# Patient Record
Sex: Female | Born: 1990 | Hispanic: Yes | Marital: Single | Smoking: Never smoker
Health system: Southern US, Community
[De-identification: ages and names within clinical notes are randomized; demographics above are authoritative.]

## PROBLEM LIST (undated history)

## (undated) DIAGNOSIS — Z789 Other specified health status: Secondary | ICD-10-CM

## (undated) HISTORY — PX: NO PAST SURGERIES: SHX2092

---
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
## 2011-02-15 LAB — GC/CHLAMYDIA PROBE AMP, GENITAL: Chlamydia: NEGATIVE

## 2011-03-11 ENCOUNTER — Inpatient Hospital Stay (HOSPITAL_COMMUNITY)
Admission: AD | Admit: 2011-03-11 | Discharge: 2011-03-13 | DRG: 775 | Disposition: A | Discharge status: Home / Self Care | Payer: Medicaid Other | Source: Ambulatory Visit | Attending: Obstetrics | Admitting: Obstetrics

## 2011-03-11 ENCOUNTER — Encounter (HOSPITAL_COMMUNITY): Payer: Self-pay | Admitting: *Deleted

## 2011-03-11 DIAGNOSIS — O99892 Other specified diseases and conditions complicating childbirth: Principal | ICD-10-CM | POA: Diagnosis present

## 2011-03-11 DIAGNOSIS — D649 Anemia, unspecified: Secondary | ICD-10-CM | POA: Diagnosis not present

## 2011-03-11 DIAGNOSIS — O9903 Anemia complicating the puerperium: Secondary | ICD-10-CM | POA: Diagnosis not present

## 2011-03-11 DIAGNOSIS — Z22330 Carrier of Group B streptococcus: Secondary | ICD-10-CM

## 2011-03-11 DIAGNOSIS — IMO0002 Reserved for concepts with insufficient information to code with codable children: Secondary | ICD-10-CM | POA: Clinically undetermined

## 2011-03-11 LAB — CBC
MCH: 25.2 pg — ABNORMAL LOW (ref 26.0–34.0)
MCHC: 32.1 g/dL (ref 30.0–36.0)
MCV: 78.6 fL (ref 78.0–100.0)
Platelets: 203 10*3/uL (ref 150–400)
RDW: 15.6 % — ABNORMAL HIGH (ref 11.5–15.5)
WBC: 12.9 10*3/uL — ABNORMAL HIGH (ref 4.0–10.5)

## 2011-03-11 MED ORDER — DIBUCAINE 1 % RE OINT: 1.0000 "application " | TOPICAL_OINTMENT | RECTAL | Status: DC | PRN

## 2011-03-11 MED ORDER — LIDOCAINE HCL (PF) 1 % IJ SOLN
30.0000 mL | INTRAMUSCULAR | Status: DC | PRN
  Administered 2011-03-11: 30 mL via SUBCUTANEOUS
  Filled 2011-03-11: qty 30

## 2011-03-11 MED ORDER — ONDANSETRON HCL 4 MG PO TABS: 4.0000 mg | ORAL_TABLET | ORAL | Status: DC | PRN

## 2011-03-11 MED ORDER — ONDANSETRON HCL 4 MG/2ML IJ SOLN: 4.0000 mg | Freq: Four times a day (QID) | INTRAMUSCULAR | Status: DC | PRN

## 2011-03-11 MED ORDER — BENZOCAINE-MENTHOL 20-0.5 % EX AERO
1.0000 "application " | INHALATION_SPRAY | CUTANEOUS | Status: DC | PRN
  Administered 2011-03-11: 1 via TOPICAL

## 2011-03-11 MED ORDER — OXYTOCIN 20 UNITS IN LACTATED RINGERS INFUSION - SIMPLE
125.0000 mL/h | Freq: Once | INTRAVENOUS | Status: DC
Start: 2011-03-11 — End: 2011-03-11

## 2011-03-11 MED ORDER — LACTATED RINGERS IV SOLN: 500.0000 mL | INTRAVENOUS | Status: DC | PRN

## 2011-03-11 MED ORDER — CLINDAMYCIN PHOSPHATE 900 MG/50ML IV SOLN
900.0000 mg | Freq: Three times a day (TID) | INTRAVENOUS | Status: AC
  Administered 2011-03-11 – 2011-03-12 (×3): 900 mg via INTRAVENOUS
  Filled 2011-03-11 (×4): qty 50

## 2011-03-11 MED ORDER — METHYLERGONOVINE MALEATE 0.2 MG/ML IJ SOLN
INTRAMUSCULAR | Status: AC
  Administered 2011-03-11: 0.2 mg via INTRAMUSCULAR
  Filled 2011-03-11: qty 1

## 2011-03-11 MED ORDER — PENICILLIN G POTASSIUM 5000000 UNITS IJ SOLR
5.0000 10*6.[IU] | Freq: Once | INTRAVENOUS | Status: DC
  Administered 2011-03-11: 5 10*6.[IU] via INTRAVENOUS
  Filled 2011-03-11: qty 5

## 2011-03-11 MED ORDER — ZOLPIDEM TARTRATE 5 MG PO TABS: 5.0000 mg | ORAL_TABLET | Freq: Every evening | ORAL | Status: DC | PRN

## 2011-03-11 MED ORDER — SIMETHICONE 80 MG PO CHEW: 80.0000 mg | CHEWABLE_TABLET | ORAL | Status: DC | PRN

## 2011-03-11 MED ORDER — OXYTOCIN 10 UNIT/ML IJ SOLN
INTRAMUSCULAR | Status: AC
  Administered 2011-03-11: 20 [IU]
  Filled 2011-03-11: qty 2

## 2011-03-11 MED ORDER — OXYCODONE-ACETAMINOPHEN 5-325 MG PO TABS: 2.0000 | ORAL_TABLET | ORAL | Status: DC | PRN

## 2011-03-11 MED ORDER — LACTATED RINGERS IV SOLN
INTRAVENOUS | Status: DC
  Administered 2011-03-11: 03:00:00 via INTRAVENOUS

## 2011-03-11 MED ORDER — IBUPROFEN 600 MG PO TABS
600.0000 mg | ORAL_TABLET | Freq: Four times a day (QID) | ORAL | Status: DC | PRN
  Administered 2011-03-11: 600 mg via ORAL
  Filled 2011-03-11: qty 1

## 2011-03-11 MED ORDER — SENNOSIDES-DOCUSATE SODIUM 8.6-50 MG PO TABS
2.0000 | ORAL_TABLET | Freq: Every day | ORAL | Status: DC
  Administered 2011-03-11 – 2011-03-12 (×2): 2 via ORAL

## 2011-03-11 MED ORDER — FERROUS SULFATE 325 (65 FE) MG PO TABS
325.0000 mg | ORAL_TABLET | Freq: Two times a day (BID) | ORAL | Status: DC
  Administered 2011-03-11 – 2011-03-13 (×5): 325 mg via ORAL
  Filled 2011-03-11 (×5): qty 1

## 2011-03-11 MED ORDER — ACETAMINOPHEN 325 MG PO TABS: 650.0000 mg | ORAL_TABLET | ORAL | Status: DC | PRN

## 2011-03-11 MED ORDER — DIPHENHYDRAMINE HCL 25 MG PO CAPS
25.0000 mg | ORAL_CAPSULE | Freq: Four times a day (QID) | ORAL | Status: DC | PRN
Start: 2011-03-11 — End: 2011-03-13

## 2011-03-11 MED ORDER — OXYTOCIN BOLUS FROM INFUSION
500.0000 mL | Freq: Once | INTRAVENOUS | Status: DC
  Administered 2011-03-11: 500 mL via INTRAVENOUS
  Filled 2011-03-11: qty 1000
  Filled 2011-03-11: qty 500

## 2011-03-11 MED ORDER — TETANUS-DIPHTH-ACELL PERTUSSIS 5-2.5-18.5 LF-MCG/0.5 IM SUSP: 0.5000 mL | Freq: Once | INTRAMUSCULAR | Status: DC

## 2011-03-11 MED ORDER — METHYLERGONOVINE MALEATE 0.2 MG/ML IJ SOLN
0.2000 mg | Freq: Once | INTRAMUSCULAR | Status: AC
  Administered 2011-03-11: 0.2 mg via INTRAMUSCULAR

## 2011-03-11 MED ORDER — DIPHENHYDRAMINE HCL 50 MG/ML IJ SOLN
50.0000 mg | Freq: Once | INTRAMUSCULAR | Status: AC
  Administered 2011-03-11: 50 mg via INTRAVENOUS
  Filled 2011-03-11: qty 1

## 2011-03-11 MED ORDER — LANOLIN HYDROUS EX OINT: TOPICAL_OINTMENT | CUTANEOUS | Status: DC | PRN

## 2011-03-11 MED ORDER — FLEET ENEMA 7-19 GM/118ML RE ENEM: 1.0000 | ENEMA | RECTAL | Status: DC | PRN

## 2011-03-11 MED ORDER — PRENATAL PLUS 27-1 MG PO TABS
1.0000 | ORAL_TABLET | Freq: Every day | ORAL | Status: DC
  Administered 2011-03-11 – 2011-03-13 (×3): 1 via ORAL
  Filled 2011-03-11 (×3): qty 1

## 2011-03-11 MED ORDER — BENZOCAINE-MENTHOL 20-0.5 % EX AERO
INHALATION_SPRAY | CUTANEOUS | Status: AC
  Filled 2011-03-11: qty 56

## 2011-03-11 MED ORDER — PENICILLIN G POTASSIUM 5000000 UNITS IJ SOLR
2.5000 10*6.[IU] | INTRAMUSCULAR | Status: DC
  Filled 2011-03-11 (×3): qty 2.5

## 2011-03-11 MED ORDER — PROMETHAZINE HCL 25 MG/ML IJ SOLN
12.5000 mg | Freq: Four times a day (QID) | INTRAMUSCULAR | Status: DC | PRN
  Administered 2011-03-11: 12.5 mg via INTRAVENOUS
  Filled 2011-03-11: qty 1

## 2011-03-11 MED ORDER — IBUPROFEN 600 MG PO TABS
600.0000 mg | ORAL_TABLET | Freq: Four times a day (QID) | ORAL | Status: DC
  Administered 2011-03-11 – 2011-03-13 (×9): 600 mg via ORAL
  Filled 2011-03-11 (×9): qty 1

## 2011-03-11 MED ORDER — ONDANSETRON HCL 4 MG/2ML IJ SOLN: 4.0000 mg | INTRAMUSCULAR | Status: DC | PRN

## 2011-03-11 MED ORDER — OXYTOCIN 10 UNIT/ML IJ SOLN
20.0000 [IU] | Freq: Once | INTRAMUSCULAR | Status: AC
  Administered 2011-03-11: 20 [IU]

## 2011-03-11 MED ORDER — WITCH HAZEL-GLYCERIN EX PADS: 1.0000 "application " | MEDICATED_PAD | CUTANEOUS | Status: DC | PRN

## 2011-03-11 MED ORDER — BUTORPHANOL TARTRATE 2 MG/ML IJ SOLN
1.0000 mg | INTRAMUSCULAR | Status: DC | PRN
  Administered 2011-03-11: 1 mg via INTRAVENOUS
  Filled 2011-03-11: qty 1

## 2011-03-11 MED ORDER — OXYCODONE-ACETAMINOPHEN 5-325 MG PO TABS: 1.0000 | ORAL_TABLET | ORAL | Status: DC | PRN

## 2011-03-11 MED ORDER — CITRIC ACID-SODIUM CITRATE 334-500 MG/5ML PO SOLN: 30.0000 mL | ORAL | Status: DC | PRN

## 2011-03-11 NOTE — Progress Notes (Signed)
UR chart review completed.  

## 2011-03-11 NOTE — Progress Notes (Signed)
MD notified of rash on patients hands, arms, abdomen, and upper thighs. Given an updated on the medications patient received in the past hour. Orders received to discontinue penicillin, change antibiotic to clindamycin, and give 50 mg of benadryl IV. MD also notified of membrane status and FHR.

## 2011-03-11 NOTE — Progress Notes (Signed)
Notified of SVE and FHR. Gaynell Face, MD, said that he is heading to the hospital at this time.

## 2011-03-11 NOTE — H&P (Signed)
This is Dr. dictating the history and physical on  Brenda Cline She's a 20 year old gravida 2 para 0010 at 68 weeks and 2 days she's a 2 03/16/2011 is a positive GBS and after she was washed him with a chlorhexidine she became red the nose there is she also received penicillin at that point she had no itching which was given Benadryl 50 IV and changed to clindamycin 900 every 8 hours On admission her cervix was 4 cm she is no dilated membranes ruptured spontaneously the fluid was clear and she is now on a him a dilated him up with 2 station Past medical history negative Past surgical history negative Family history negative Negative outages is a possibility of penicillin allergy from child this was chart and him Physical exam Well-developed female in no distress HEENT negative Lungs clear Heart the heart regular rhythm no murmurs no gallops breasts negative Abdomen term Pelvic as described above Extremities negative

## 2011-03-11 NOTE — Progress Notes (Addendum)
RN at bedside. While assessing membrane status red rash noted on patients hands, arms, abdomen, and upper thighs. No shortness of breath or itching reported by patient. RN was at bedside speaking with patient at 0340 and rash was not present. RN to notify MD of this finding.

## 2011-03-11 NOTE — Progress Notes (Signed)
Interventions done to rule out allergic reaction: aqua sonic gel was changed to lotion, belts were replaced, EFM/TOCO/BP cuff were sanitized, and patient's abdomen was washed with soap and water.

## 2011-03-11 NOTE — Progress Notes (Signed)
[facility] Department of Clinical Social Work Documentation of Interpretation   I assisted ___Betty RN________________ with interpretation of ____teaching__________________ for this patient.

## 2011-03-11 NOTE — Progress Notes (Signed)
SAYS HURT BAD AT 2300- UNSURE IF SROM- [address]  [address] - CLOSED

## 2011-03-12 LAB — CBC
Hemoglobin: 8.3 g/dL — ABNORMAL LOW (ref 12.0–15.0)
MCH: 25.9 pg — ABNORMAL LOW (ref 26.0–34.0)
MCHC: 32.3 g/dL (ref 30.0–36.0)
RDW: 15.9 % — ABNORMAL HIGH (ref 11.5–15.5)

## 2011-03-12 MED ORDER — CLINDAMYCIN PHOSPHATE 900 MG/50ML IV SOLN
900.0000 mg | Freq: Once | INTRAVENOUS | Status: DC
  Filled 2011-03-12: qty 50

## 2011-03-13 DIAGNOSIS — IMO0002 Reserved for concepts with insufficient information to code with codable children: Secondary | ICD-10-CM | POA: Clinically undetermined

## 2011-03-13 MED ORDER — NORETHINDRONE 0.35 MG PO TABS: 1.0000 | ORAL_TABLET | Freq: Every day | ORAL | Status: DC

## 2011-03-13 MED ORDER — PRENATAL PLUS 27-1 MG PO TABS: 1.0000 | ORAL_TABLET | Freq: Every day | ORAL | Status: DC

## 2011-03-13 MED ORDER — FERROUS SULFATE 325 (65 FE) MG PO TABS: 325.0000 mg | ORAL_TABLET | Freq: Two times a day (BID) | ORAL | Status: DC

## 2011-03-13 MED ORDER — IBUPROFEN 600 MG PO TABS: 600.0000 mg | ORAL_TABLET | Freq: Four times a day (QID) | ORAL | Status: AC

## 2011-03-13 NOTE — Progress Notes (Signed)
PSYCHOSOCIAL ASSESSMENT ~ MATERNAL/CHILD Name: Brenda Cline         Age: 20 years    Referral Date:  03/13/11  Reason/Source:  History of Pregnancy depression/Pediatric Teaching Service I. FAMILY/HOME ENVIRONMENT A. Child's Legal Guardian X Parent(s)  Name:  Brenda Cline DOB:  Jan 14, 2005  Age:  20 years Address:  [address] [place], [place] [address] Name:  Brenda Cline     Address :  same B. Other Household Members/Support Persons   Roommate shares a home with family C.   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   Cultural and Environment Information Cultural Issues Impacting Care:  MOB does not have papers which affects ability to access resources to improved life situation  STRENGTHS X Supportive family/friends   X Adequate Resources  X Home prepared for Child (including basic supplies)                         X Other:  Pediatrician-[facility]  RISK FACTORS AND CURRENT PROBLEMS         X No Problems Noted                       SOCIAL WORK ASSESSMENT LCSW met with MOB and newborn at bedside to assess strengths, needs, resources related to referral for history of depression during pregnancy.  MOB lives with FOB.  They have a roommate who lives in the home and spends time with his girlfriend.  MOB feels safe and supported.  Her pregnancy was marked by emotional stress and uncertainty about providing for baby.  During pregnancy, MOB was asked to leave her  mother's home when the pregnancy was discovered.  MOB moved in with FOB.  The initial move was complicated by a roommate who had feelings for FOB, which FOB did not reciprocate, and roommate did not want MOB in the house.  MOB and FOB found alternative housing and their living situation has improved.  MOB was laid off during the pregnancy as was FOB for a brief time.  FOB is now employed, and MOB reports they are able to meet their basic needs.  MOB had a lot of certainty about her ability to parent, take care of a newborn, and improve her life situation.  I validated and normalized these feelings as very common and universal for many first time mom's.  She has been receiving support from the [facility] home visiting program for first time moms since her 6th month of pregnancy.  She plans to continue the program, and the nurse comes out every two weeks.  Mom also reported that she very much wants to be a CNA.  She loves that line of work, especially providing care to the geriatric population.  Mom was very discouraged  in high school when she completed all her CNA course work, but was denied the right to take the exam due to not having a Tree surgeon number or papers to live and work here.  Mom was still very tearful about this, and she acknowledged she felt her dreams were no longer in reach.  She has since applied 2 months ago for a new program designed to uplift immigrants who came to this country as children in accessing the documents necessary to pursue higher education and reach her goals.  She is excited about this program and very much hopes it will allow her to pursue her goals.  She wants to be a good mom and provide a better future for her child so she can have a better life than what MOB feels she had.  Commended MOB for not giving up and using her problem solving skills to pursue needed supports/programs to improve her life circumstance.  MOB also wants to breastfeed, as she feels it  will afford baby a good start in life.  FOB is also very supportive of breastfeeding and had discussed the benefits to MOB.  MOB is very resilient.  She hopes that over time, maternal grandma will come back into her life in a more supportive role.  MOB has some friends she can count on as well.  FOB does not have family in the area.  She reports FOB as supportive.  MOB reported feeling better after session due to being able to have someone to talk about her feelings.  She reports not having taken medications in the past to help with depression.  She is open to continuing to reach out and talk about her feelings to feel better about herself, her role as a new mother and her future.  Discussed services available at her Pediatrician's office as well as in the community.  MOB will continue to seek support as needed.  MOB is happy for baby's arrival.  She regards her baby warmly, and MOB was loving, attentive, and able to meet baby's needs during session.  MOB exhibited calm, gentle handling and cuddling with baby.  MOB's affect improved by end of session.  She reported improved hopefulness and maternal competence.  She still acknowledges appropriate maternal worry over newborn needs, care, and ability to respond effectively, though she feels the supports available to her will help ease her transition to motherhood to help her be successful.    SOCIAL WORK PLAN X No Further Intervention Required/No Barriers to Discharge  X Patient/Family Education: Newborn care, Maternal role transition, Newborn breastfeeding behavior X Follow up with [facility] staff as needed for additional support X Continue with Nurse [facility] visiting program

## 2011-03-13 NOTE — Progress Notes (Signed)
Post Partum Day #2 S/P:spontaneous vaginal  RH status/Rubella reviewed.  Feeding: breast and bottle Subjective: No HA, SOB, CP, F/C, breast symptoms: no. Normal vaginal bleeding, no clots.     Objective:  Blood pressure 113/75, pulse 86, temperature 98.4 F (36.9 C), temperature source Oral, resp. rate 18, height 4\' 11"  (1.499 m), weight 80.854 kg (178 lb 4 oz), SpO2 95.00%, unknown if currently breastfeeding.   Physical Exam:  General: alert Lochia: appropriate Uterine Fundus: firm DVT Evaluation: No evidence of DVT seen on physical exam. Ext: No c/c/e  Basename 03/12/11 0531 03/11/11 0240  HGB 8.3* 11.2*  HCT 25.7* 34.9*    Assessment/Plan: 20 y.o.  PPD # 2 .  normal postpartum exam patient is a candidate for oral progesterone-only contraceptive for contraception, with no contraindications Anemia stable Continue current postpartum care D/C home   LOS: 2 days   Cline,Brenda Tupou A 03/13/2011, 11:46 AM

## 2011-03-13 NOTE — Discharge Summary (Signed)
  Obstetric Discharge Summary Reason for Admission: onset of labor Prenatal Procedures: none Intrapartum Procedures: spontaneous vaginal delivery Postpartum Procedures: none Complications-Operative and Postpartum: none  Hemoglobin  Date Value Range Status  03/12/2011 8.3* 12.0-15.0 (g/dL) Final     DELTA CHECK NOTED     REPEATED TO VERIFY     HCT  Date Value Range Status  03/12/2011 25.7* 36.0-46.0 (%) Final    Discharge Diagnoses: Term Pregnancy-delivered  Discharge Information: Date: 03/13/2011 Activity: pelvic rest Diet: routine Medications:  Prior to Admission medications   Medication Sig Start Date End Date Taking? Authorizing Provider  ferrous sulfate 325 (65 FE) MG tablet Take 1 tablet (325 mg total) by mouth 2 (two) times daily with a meal. 03/13/11 03/12/12  Roseanna Rainbow, MD  ibuprofen (ADVIL,MOTRIN) 600 MG tablet Take 1 tablet (600 mg total) by mouth every 6 (six) hours. 03/13/11 03/23/11  Roseanna Rainbow, MD  norethindrone (ORTHO MICRONOR) 0.35 MG tablet Take 1 tablet (0.35 mg total) by mouth daily. 2nd Sunday start 03/13/11 03/12/12  Roseanna Rainbow, MD  prenatal vitamin w/FE, FA (PRENATAL 1 + 1) 27-1 MG TABS Take 1 tablet by mouth daily. 03/13/11   Roseanna Rainbow, MD    Condition: stable Instructions: refer to routine discharge instructions Discharge to: home Follow-up Information    Follow up with JACKSON-MOORE,Tremond Shimabukuro A, MD. Call in 6 weeks.   Contact information:   [address] [place] [address] [phone]          Newborn Data: Live born  Information for the patient's newborn:  Clemens Catholic, Girl Tannie [[id number]]  female ; APGAR , ; weight ;  Home with mother.  JACKSON-MOORE,Tyese Finken A 03/13/2011, 12:00 PM

## 2012-05-09 NOTE — L&D Delivery Note (Signed)
Delivery Note At  a viable female was delivered via NSVD (Presentation: LOA).  APGAR and weight: not yet obtained.   Placenta status: intact with 3 vessel cord. No complications with delivery and shoulders. No cord. Rapid delivery.  Bleeding controled with fundal massage and pitocin Anesthesia:  IV fentanyl Episiotomy: none Lacerations: none Suture Repair: NA Est. Blood Loss (mL): 150 cc  Mom to postpartum.  Baby to nursery-stable.  [name] 06/13/2012, 7:04 PM

## 2012-05-09 NOTE — L&D Delivery Note (Signed)
I was present for delivery and agree with note above. MUHAMMAD,Nevae Pinnix  

## 2012-06-11 ENCOUNTER — Encounter (HOSPITAL_COMMUNITY): Payer: Self-pay

## 2012-06-11 ENCOUNTER — Inpatient Hospital Stay (HOSPITAL_COMMUNITY)
Admission: AD | Admit: 2012-06-11 | Discharge: 2012-06-11 | Disposition: A | Discharge status: Home / Self Care | Payer: Self-pay | Source: Ambulatory Visit | Attending: Obstetrics & Gynecology | Admitting: Obstetrics & Gynecology

## 2012-06-11 DIAGNOSIS — O0930 Supervision of pregnancy with insufficient antenatal care, unspecified trimester: Secondary | ICD-10-CM | POA: Insufficient documentation

## 2012-06-11 DIAGNOSIS — O99891 Other specified diseases and conditions complicating pregnancy: Secondary | ICD-10-CM | POA: Insufficient documentation

## 2012-06-11 HISTORY — DX: Other specified health status: Z78.9

## 2012-06-11 NOTE — MAU Note (Signed)
Patient states she has had no prenatal care and just wants to make sure everything if OK. Patient denies contractions, pain, bleeding or leaking and reports good fetal movement.

## 2012-06-11 NOTE — ED Notes (Signed)
Chief Complaint:  No chief complaint on file.   First Provider Initiated Contact with Patient 06/11/12 1424      HPI: Brenda Cline is a 22 y.o. G3P1011 at [redacted]w[redacted]d who presents to maternity admissions for ultrasound. Patient has not received any prenatal care for this pregnancy. She has not applied for medicaid or adopt a mom.  Denies contractions, leakage of fluid or vaginal bleeding. Good fetal movement.   LMP: 1st week of July. Menstrual cycle not regular since last pregnancy: 25-40 days. Not on contraception prior to pregnancy.  No history of STD.   She received prenatal care for her previous pregnancy in 2012 at [place] where she had adopt a mom. Pregnancy was term, NSVD, GBS positive, no complications including GDM or HTN.   Pregnancy Course:  No prenatal care.  Past Medical History: Past Medical History  Diagnosis Date  . No pertinent past medical history     Past obstetric history: OB History    Grav Para Term Preterm Abortions TAB SAB Ect Mult Living   2 1 1       1      # Outc Date GA Lbr Len/2nd Wgt Sex Del Anes PTL Lv   1 TRM 11/12 [redacted]w[redacted]d 06:40 / 00:12 7lb14.5oz(3.586kg) F SVD Local  Yes   2 CUR               Past Surgical History: Past Surgical History  Procedure Date  . No past surgeries     Family History: Family History  Problem Relation Age of Onset  . Other Neg Hx     Social History: History  Substance Use Topics  . Smoking status: Never Smoker   . Smokeless tobacco: Never Used  . Alcohol Use: No    Allergies:  Allergies  Allergen Reactions  . Penicillins Rash    Meds:  Prescriptions prior to admission  Medication Sig Dispense Refill  . Prenatal Vit-Fe Fumarate-FA (PRENATAL MULTIVITAMIN) TABS Take 1 tablet by mouth daily.        ROS: Pertinent findings in history of present illness.  Physical Exam  Blood pressure 115/68, pulse 88, temperature 97.9 F (36.6 C), temperature source Oral, resp. rate 16, height 5' 0.5" (1.537 m),  weight 175 lb 3.2 oz (79.47 kg), last menstrual period 11/07/2011, SpO2 99.00%, unknown if currently breastfeeding. GENERAL: Well-developed, well-nourished female in no acute distress.  HEENT: normocephalic HEART: normal rate RESP: normal effort ABDOMEN: Soft, non-tender, gravid appropriate for gestational age EXTREMITIES: Nontender, no edema NEURO: alert and oriented    FHT:  Baseline 135 , moderate variability, accelerations present 10x10, no decelerations Contractions: irritability, no real contractions   Labs: No results found for this or any previous visit (from the past 24 hour(s)).  Imaging:  No results found. MAU Course:   Assessment: 1. Late prenatal care    22 yo G3P1011 at 31wga by LMP.  Plan:  - Late prenatal care and access to future care: spoke with CSW Tedra who told me that she would be eligible for emergency medicaid which would only cover L&D costs. There is possibility to apply for charity help. Will give patient the name and number of Burman Foster who might be able to help her with this. appointment [facility] made for Thursday February 13th at 9am.   - measuring greater than estimated gestational age: will obtain US for as soon as possible.  - Discharge home  Labor precautions and fetal kick counts Follow-up Information  Follow up with [facility]. On 06/21/2012.   Contact information:   [address] [place] [address]           Medication List     As of 06/11/2012  2:57 PM    TAKE these medications         prenatal multivitamin Tabs   Take 1 tablet by mouth daily.        Lonia Skinner, MD 06/11/2012 2:57 PM  I saw and examined patient and agree with above resident note. Patient to follow up with outpatient ultrasound and appointment at [facility]. I reviewed NST, which was reactive. Napoleon Form, MD

## 2012-06-12 DIAGNOSIS — Z3201 Encounter for pregnancy test, result positive: Secondary | ICD-10-CM

## 2012-06-13 ENCOUNTER — Inpatient Hospital Stay (HOSPITAL_COMMUNITY)
Admission: AD | Admit: 2012-06-13 | Discharge: 2012-06-15 | DRG: 775 | Disposition: A | Discharge status: Home / Self Care | Payer: Medicaid Other | Source: Ambulatory Visit | Attending: Obstetrics and Gynecology | Admitting: Obstetrics and Gynecology

## 2012-06-13 ENCOUNTER — Inpatient Hospital Stay (HOSPITAL_COMMUNITY): Payer: Medicaid Other

## 2012-06-13 ENCOUNTER — Encounter (HOSPITAL_COMMUNITY): Payer: Self-pay | Admitting: *Deleted

## 2012-06-13 DIAGNOSIS — D649 Anemia, unspecified: Secondary | ICD-10-CM | POA: Diagnosis not present

## 2012-06-13 DIAGNOSIS — O9903 Anemia complicating the puerperium: Secondary | ICD-10-CM

## 2012-06-13 DIAGNOSIS — O0930 Supervision of pregnancy with insufficient antenatal care, unspecified trimester: Secondary | ICD-10-CM

## 2012-06-13 DIAGNOSIS — IMO0002 Reserved for concepts with insufficient information to code with codable children: Secondary | ICD-10-CM

## 2012-06-13 LAB — HEPATITIS B SURFACE ANTIGEN: Hepatitis B Surface Ag: NEGATIVE

## 2012-06-13 LAB — RAPID HIV SCREEN (WH-MAU): Rapid HIV Screen: NONREACTIVE

## 2012-06-13 LAB — DIFFERENTIAL
Basophils Absolute: 0 10*3/uL (ref 0.0–0.1)
Basophils Relative: 0 % (ref 0–1)
Eosinophils Absolute: 0 10*3/uL (ref 0.0–0.7)
Eosinophils Relative: 0 % (ref 0–5)
Neutrophils Relative %: 70 % (ref 43–77)

## 2012-06-13 LAB — ABO/RH: ABO/RH(D): O POS

## 2012-06-13 LAB — RAPID URINE DRUG SCREEN, HOSP PERFORMED
Amphetamines: NOT DETECTED
Barbiturates: NOT DETECTED
Benzodiazepines: NOT DETECTED

## 2012-06-13 LAB — TYPE AND SCREEN
ABO/RH(D): O POS
Antibody Screen: NEGATIVE

## 2012-06-13 LAB — CBC
MCH: 21.9 pg — ABNORMAL LOW (ref 26.0–34.0)
MCV: 70.3 fL — ABNORMAL LOW (ref 78.0–100.0)
Platelets: 240 10*3/uL (ref 150–400)
RDW: 16.2 % — ABNORMAL HIGH (ref 11.5–15.5)

## 2012-06-13 LAB — OB RESULTS CONSOLE HIV ANTIBODY (ROUTINE TESTING): HIV: NONREACTIVE

## 2012-06-13 MED ORDER — LACTATED RINGERS IV SOLN: 500.0000 mL | INTRAVENOUS | Status: DC | PRN

## 2012-06-13 MED ORDER — LANOLIN HYDROUS EX OINT: TOPICAL_OINTMENT | CUTANEOUS | Status: DC | PRN

## 2012-06-13 MED ORDER — ONDANSETRON HCL 4 MG/2ML IJ SOLN: 4.0000 mg | Freq: Four times a day (QID) | INTRAMUSCULAR | Status: DC | PRN

## 2012-06-13 MED ORDER — BENZOCAINE-MENTHOL 20-0.5 % EX AERO
1.0000 "application " | INHALATION_SPRAY | CUTANEOUS | Status: DC | PRN
  Administered 2012-06-14: 1 via TOPICAL
  Filled 2012-06-13: qty 56

## 2012-06-13 MED ORDER — CITRIC ACID-SODIUM CITRATE 334-500 MG/5ML PO SOLN: 30.0000 mL | ORAL | Status: DC | PRN

## 2012-06-13 MED ORDER — OXYTOCIN 40 UNITS IN LACTATED RINGERS INFUSION - SIMPLE MED: 62.5000 mL/h | INTRAVENOUS | Status: DC

## 2012-06-13 MED ORDER — ONDANSETRON HCL 4 MG PO TABS: 4.0000 mg | ORAL_TABLET | ORAL | Status: DC | PRN

## 2012-06-13 MED ORDER — PRENATAL MULTIVITAMIN CH
1.0000 | ORAL_TABLET | Freq: Every day | ORAL | Status: DC
  Administered 2012-06-14 – 2012-06-15 (×2): 1 via ORAL
  Filled 2012-06-13 (×2): qty 1

## 2012-06-13 MED ORDER — DIPHENHYDRAMINE HCL 25 MG PO CAPS: 25.0000 mg | ORAL_CAPSULE | Freq: Four times a day (QID) | ORAL | Status: DC | PRN

## 2012-06-13 MED ORDER — ACETAMINOPHEN 325 MG PO TABS: 650.0000 mg | ORAL_TABLET | ORAL | Status: DC | PRN

## 2012-06-13 MED ORDER — ZOLPIDEM TARTRATE 5 MG PO TABS: 5.0000 mg | ORAL_TABLET | Freq: Every evening | ORAL | Status: DC | PRN

## 2012-06-13 MED ORDER — FENTANYL CITRATE 0.05 MG/ML IJ SOLN
INTRAMUSCULAR | Status: AC
  Administered 2012-06-13: 100 ug via INTRAVENOUS
  Filled 2012-06-13: qty 2

## 2012-06-13 MED ORDER — OXYTOCIN 40 UNITS IN LACTATED RINGERS INFUSION - SIMPLE MED
INTRAVENOUS | Status: AC
  Filled 2012-06-13: qty 1000

## 2012-06-13 MED ORDER — IBUPROFEN 600 MG PO TABS
600.0000 mg | ORAL_TABLET | Freq: Four times a day (QID) | ORAL | Status: DC
  Administered 2012-06-14 – 2012-06-15 (×7): 600 mg via ORAL
  Filled 2012-06-13 (×7): qty 1

## 2012-06-13 MED ORDER — LIDOCAINE HCL (PF) 1 % IJ SOLN
30.0000 mL | INTRAMUSCULAR | Status: DC | PRN
  Filled 2012-06-13: qty 30

## 2012-06-13 MED ORDER — TETANUS-DIPHTH-ACELL PERTUSSIS 5-2.5-18.5 LF-MCG/0.5 IM SUSP
0.5000 mL | Freq: Once | INTRAMUSCULAR | Status: AC
  Administered 2012-06-15: 0.5 mL via INTRAMUSCULAR
  Filled 2012-06-13: qty 0.5

## 2012-06-13 MED ORDER — OXYTOCIN BOLUS FROM INFUSION
500.0000 mL | INTRAVENOUS | Status: DC
  Administered 2012-06-13: 500 mL via INTRAVENOUS

## 2012-06-13 MED ORDER — IBUPROFEN 600 MG PO TABS: 600.0000 mg | ORAL_TABLET | Freq: Four times a day (QID) | ORAL | Status: DC | PRN

## 2012-06-13 MED ORDER — WITCH HAZEL-GLYCERIN EX PADS: 1.0000 "application " | MEDICATED_PAD | CUTANEOUS | Status: DC | PRN

## 2012-06-13 MED ORDER — OXYCODONE-ACETAMINOPHEN 5-325 MG PO TABS: 1.0000 | ORAL_TABLET | ORAL | Status: DC | PRN

## 2012-06-13 MED ORDER — CLINDAMYCIN PHOSPHATE 900 MG/50ML IV SOLN
900.0000 mg | Freq: Three times a day (TID) | INTRAVENOUS | Status: DC
  Administered 2012-06-13: 900 mg via INTRAVENOUS
  Filled 2012-06-13 (×2): qty 50

## 2012-06-13 MED ORDER — DIBUCAINE 1 % RE OINT: 1.0000 "application " | TOPICAL_OINTMENT | RECTAL | Status: DC | PRN

## 2012-06-13 MED ORDER — ONDANSETRON HCL 4 MG/2ML IJ SOLN: 4.0000 mg | INTRAMUSCULAR | Status: DC | PRN

## 2012-06-13 MED ORDER — FENTANYL CITRATE 0.05 MG/ML IJ SOLN
100.0000 ug | INTRAMUSCULAR | Status: DC | PRN
  Administered 2012-06-13: 100 ug via INTRAVENOUS

## 2012-06-13 MED ORDER — SIMETHICONE 80 MG PO CHEW: 80.0000 mg | CHEWABLE_TABLET | ORAL | Status: DC | PRN

## 2012-06-13 MED ORDER — OXYCODONE-ACETAMINOPHEN 5-325 MG PO TABS
1.0000 | ORAL_TABLET | ORAL | Status: DC | PRN
  Administered 2012-06-14: 1 via ORAL
  Administered 2012-06-14: 2 via ORAL
  Administered 2012-06-14: 1 via ORAL
  Filled 2012-06-13 (×2): qty 2
  Filled 2012-06-13: qty 1

## 2012-06-13 MED ORDER — LACTATED RINGERS IV SOLN
INTRAVENOUS | Status: DC
  Administered 2012-06-13: 15:00:00 via INTRAVENOUS

## 2012-06-13 MED ORDER — SENNOSIDES-DOCUSATE SODIUM 8.6-50 MG PO TABS
2.0000 | ORAL_TABLET | Freq: Every day | ORAL | Status: DC
  Administered 2012-06-13 – 2012-06-14 (×2): 2 via ORAL

## 2012-06-13 NOTE — MAU Note (Signed)
Pt waiting for bed

## 2012-06-13 NOTE — H&P (Signed)
Brenda Cline is a 22 y.o. female presenting for active labor. Patient reports onset of contractions earlier this morning but worsened over the past 2 hours. Patient without any prenatal care during the pregnancy. Patient reports LMP during first week of July but admits to being irregular since her last pregnancy in 2012. Patient was scheduled for dating ultrasound next week. Patient reports an uncomplicated term pregnancy delivered vaginally in 2012 under the care of [facility]. She denies h/o diabetes or HTN.   Maternal Medical History:  Reason for admission: Reason for admission: contractions.  Contractions: Onset was 3-5 hours ago.   Frequency: regular.   Duration is approximately 1 minute.   Perceived severity is moderate.    Fetal activity: Perceived fetal activity is normal.   Last perceived fetal movement was within the past hour.    Prenatal Complications - Diabetes: none.    OB History    Grav Para Term Preterm Abortions TAB SAB Ect Mult Living   2 1 1       1      Past Medical History  Diagnosis Date  . No pertinent past medical history    Past Surgical History  Procedure Date  . No past surgeries    Family History: family history is negative for Other and Hearing loss. Social History:  reports that she has never smoked. She has never used smokeless tobacco. She reports that she does not drink alcohol or use illicit drugs.   Prenatal Transfer Tool  Maternal Diabetes: No Genetic Screening: Declined Maternal Ultrasounds/Referrals: Declined Fetal Ultrasounds or other Referrals:  None Maternal Substance Abuse:  No Significant Maternal Medications:  None Significant Maternal Lab Results:  None Other Comments:  Patient presented in labor on 06/13/2012 without receiving any prenatal care  Review of Systems  All other systems reviewed and are negative.    Dilation: 5.5 Effacement (%): 100 Station: -2 Exam by:: Dr. Jolayne Panther Blood pressure 125/82, pulse 79,  temperature 98.2 F (36.8 C), temperature source Oral, resp. rate 20, last menstrual period 11/07/2011, unknown if currently breastfeeding. Exam Physical Exam  GENERAL: Well-developed, well-nourished female in no acute distress.  HEENT: Normocephalic, atraumatic. Sclerae anicteric.  NECK: Supple. Normal thyroid.  LUNGS: Clear to auscultation bilaterally.  HEART: Regular rate and rhythm. ABDOMEN: Soft, gravid, nontender. Fundal height- 37 cm. PELVIC: Normal external female genitalia. Vagina is pink and rugated.   Cervix: 5-6/100/-2 EXTREMITIES: No cyanosis, clubbing, or edema, 2+ distal pulses.  FHT: baseline 140, mod variability, +accels, no decels Toco: ctx q3-5 minutes  Prenatal labs: ABO, Rh:   Antibody:   Rubella:   RPR:    HBsAg:    HIV:    GBS:     Assessment/Plan: 22 yo G2P1 at 31w2 days by uncertain LMP presenting in active labor -  Admit to L&D - Will obtain dating ultrasound - rapid GBS collected - pain management prn - anticipate NSVD   Daundre Biel 06/13/2012, 2:30 PM

## 2012-06-13 NOTE — MAU Note (Signed)
Dr Constant at bedside. 

## 2012-06-13 NOTE — Progress Notes (Signed)
Merge Obix, previous recordings under Clemens Catholic, Kristle, same MRN

## 2012-06-13 NOTE — MAU Note (Signed)
Bedside us being performed

## 2012-06-13 NOTE — MAU Note (Signed)
No movement this morning.

## 2012-06-13 NOTE — MAU Note (Signed)
Started cramping this morning, now closer and stronger.  Had some bleeding this morning. No water leaking. 2nd preg.  Vag del with first.  No prenatal care with present preg.

## 2012-06-13 NOTE — MAU Note (Signed)
[place] complete, measures 37.4 wks per hc via U/S

## 2012-06-14 LAB — RUBELLA SCREEN: Rubella: 5.01 Index — ABNORMAL HIGH (ref <0.90)

## 2012-06-14 LAB — CBC
HCT: 23.6 % — ABNORMAL LOW (ref 36.0–46.0)
Hemoglobin: 7.5 g/dL — ABNORMAL LOW (ref 12.0–15.0)
RBC: 3.35 MIL/uL — ABNORMAL LOW (ref 3.87–5.11)
RDW: 16.3 % — ABNORMAL HIGH (ref 11.5–15.5)
WBC: 10.8 10*3/uL — ABNORMAL HIGH (ref 4.0–10.5)

## 2012-06-14 MED ORDER — INFLUENZA VIRUS VACC SPLIT PF IM SUSP
0.5000 mL | INTRAMUSCULAR | Status: AC
  Administered 2012-06-15: 0.5 mL via INTRAMUSCULAR
  Filled 2012-06-14: qty 0.5

## 2012-06-14 MED ORDER — FERROUS SULFATE 325 (65 FE) MG PO TABS
325.0000 mg | ORAL_TABLET | Freq: Three times a day (TID) | ORAL | Status: DC
  Administered 2012-06-14 – 2012-06-15 (×3): 325 mg via ORAL
  Filled 2012-06-14 (×3): qty 1

## 2012-06-14 MED ORDER — PNEUMOCOCCAL VAC POLYVALENT 25 MCG/0.5ML IJ INJ
0.5000 mL | INJECTION | Freq: Once | INTRAMUSCULAR | Status: DC
  Filled 2012-06-14: qty 0.5

## 2012-06-14 MED ORDER — INFLUENZA VIRUS VACC SPLIT PF IM SUSP: 0.5000 mL | Freq: Once | INTRAMUSCULAR | Status: DC

## 2012-06-14 NOTE — Clinical Social Work Maternal (Signed)
    Clinical Social Work Department PSYCHOSOCIAL ASSESSMENT - MATERNAL/CHILD 06/14/2012  Patient:  Brenda Cline, Brenda Cline  Account Number:  [id number]  Admit Date:  06/13/2012  Marjo Bicker Name:   [facility]    Clinical Social Worker:  Nobie Putnam, LCSW   Date/Time:  06/14/2012 04:07 PM  Date Referred:  06/14/2012   Referral source  CN     Referred reason  Other - See comment   Other referral source:    I:  FAMILY / HOME ENVIRONMENT Child's legal guardian:  PARENT  Guardian - Name Guardian - Age Guardian - Address  Anastasia Tompson [address]; [place], [place] [address]  Mauricio Sherlon Handing 30 (same as above)   Other household support members/support persons Name Relationship DOB  Roseanne Reno Juerroero-Castillo DAUGHTER 03/11/11   Other support:   Genevieve Norlander, mother    II  PSYCHOSOCIAL DATA Information Source:  Patient Interview  Financial and Community Resources Employment:   Financial resources:  Self Pay If [employer] - County:    School / Grade:   Maternity Care Coordinator / Child Services Coordination / Early Interventions:  Cultural issues impacting care:    III  STRENGTHS Strengths  Adequate Resources  Home prepared for Child (including basic supplies)  Supportive family/friends   Strength comment:    IV  RISK FACTORS AND CURRENT PROBLEMS Current Problem:  YES   Risk Factor & Current Problem Patient Issue Family Issue Risk Factor / Current Problem Comment  Other - See comment Y N NPNC    V  SOCIAL WORK ASSESSMENT CSW referral received to assess pt's reason for [facility].  Pt did not have insurance & did not have the income to pay for visit independently.  CSW explained hospital drug testing policy & she denies any use history.  UDS is negative, meconium results are pending.  Pt identified the FOB and her mother, as her primary support system.  She has supplies for the infant and appears to be bonding well.  CSW will continue to monitor drug  screen results and make a referral if needed.      VI SOCIAL WORK PLAN Social Work Plan  No Further Intervention Required / No Barriers to Discharge   Type of pt/family education:   If child protective services report - county:   If child protective services report - date:   Information/referral to community resources comment:   Other social work plan:

## 2012-06-14 NOTE — Progress Notes (Signed)
UR completed 

## 2012-06-14 NOTE — Progress Notes (Signed)
Post Partum Day 1 Subjective: up ad lib, voiding, tolerating PO, + flatus and crampy uterine pain  Objective: Blood pressure 97/65, pulse 76, temperature 97.5 F (36.4 C), temperature source Oral, resp. rate 18, height 5' 0.51" (1.537 m), weight 79.379 kg (175 lb), last menstrual period 11/07/2011, unknown if currently breastfeeding.  Physical Exam:  General: alert, cooperative and no distress Lochia: appropriate Uterine Fundus: firm Incision: n/a DVT Evaluation: No evidence of DVT seen on physical exam. Negative Homan's sign. No cords or calf tenderness. No significant calf/ankle edema.   Basename 06/14/12 0510 06/13/12 1502  HGB 7.5* 9.6*  HCT 23.6* 30.8*    Assessment/Plan: Plan for discharge tomorrow and Breastfeeding, unsure contraception Anemia:  Will start FeSO4, asymptomatic   LOS: 1 day   Napoleon Form 06/14/2012, 11:12 AM

## 2012-06-15 MED ORDER — SENNOSIDES-DOCUSATE SODIUM 8.6-50 MG PO TABS: 2.0000 | ORAL_TABLET | Freq: Every day | ORAL | Status: DC

## 2012-06-15 MED ORDER — IBUPROFEN 600 MG PO TABS: 600.0000 mg | ORAL_TABLET | Freq: Four times a day (QID) | ORAL | Status: DC

## 2012-06-15 MED ORDER — FERROUS SULFATE 325 (65 FE) MG PO TABS: 325.0000 mg | ORAL_TABLET | Freq: Three times a day (TID) | ORAL | Status: DC

## 2012-06-15 NOTE — Discharge Summary (Signed)
Saw patient and I agree with the resident note  Brenda Cline  06/15/2012

## 2012-06-15 NOTE — Discharge Summary (Signed)
Obstetric Discharge Summary Reason for Admission: onset of labor 22 yo G2P1 at 31w2 days by uncertain LMP and no prenatal care who presented in active labor. Estimated gestational age by [place] was 23 wga. Rapid GBS was negative.  Prenatal Procedures: none Intrapartum Procedures: spontaneous vaginal delivery Postpartum Procedures: patient found to be anemic but not symptomatic. Ferrous sulfate tid started.  Complications-Operative and Postpartum: none Hemoglobin  Date Value Range Status  06/14/2012 7.5* 12.0 - 15.0 g/dL Final     DELTA CHECK NOTED     REPEATED TO VERIFY     HCT  Date Value Range Status  06/14/2012 23.6* 36.0 - 46.0 % Final    Physical Exam:  General: alert, cooperative and no distress Lochia: appropriate Uterine Fundus: firm DVT Evaluation: No evidence of DVT seen on physical exam. Negative Homan's sign.  Discharge Diagnoses: pre-term pregnancy delivered- uncertain dates  Discharge Information: Date: 06/15/2012 Activity: pelvic rest Diet: routine Medications: PNV, Ibuprofen, Colace and Iron Condition: stable Instructions: refer to practice specific booklet Discharge to: home  Social Work was consulted for no prenatal care and patient was found to be safe to be discharged home.  Breastfeeding and formula feeding Plans on depo and IUD afterwards for contraception.   Newborn Data: Live born female  Birth Weight: 8 lb 4 oz (3742 g) APGAR: 9, 9  Home with mother.  Brenda Cline 06/15/2012, 7:59 AM

## 2012-06-20 ENCOUNTER — Ambulatory Visit: Payer: Medicaid Other

## 2012-06-21 ENCOUNTER — Encounter: Payer: Medicaid Other | Admitting: Obstetrics and Gynecology

## 2012-06-22 ENCOUNTER — Ambulatory Visit (HOSPITAL_COMMUNITY): Payer: Self-pay

## 2013-07-30 ENCOUNTER — Emergency Department (HOSPITAL_COMMUNITY)
Admission: EM | Admit: 2013-07-30 | Discharge: 2013-07-30 | Disposition: A | Discharge status: Home / Self Care | Payer: Self-pay | Attending: Emergency Medicine | Admitting: Emergency Medicine

## 2013-07-30 ENCOUNTER — Encounter (HOSPITAL_COMMUNITY): Payer: Self-pay | Admitting: Emergency Medicine

## 2013-07-30 DIAGNOSIS — Z79899 Other long term (current) drug therapy: Secondary | ICD-10-CM | POA: Insufficient documentation

## 2013-07-30 DIAGNOSIS — Z880 Allergy status to penicillin: Secondary | ICD-10-CM | POA: Insufficient documentation

## 2013-07-30 DIAGNOSIS — Y92410 Unspecified street and highway as the place of occurrence of the external cause: Secondary | ICD-10-CM | POA: Insufficient documentation

## 2013-07-30 DIAGNOSIS — S335XXA Sprain of ligaments of lumbar spine, initial encounter: Secondary | ICD-10-CM | POA: Insufficient documentation

## 2013-07-30 DIAGNOSIS — Y9389 Activity, other specified: Secondary | ICD-10-CM | POA: Insufficient documentation

## 2013-07-30 DIAGNOSIS — S39012A Strain of muscle, fascia and tendon of lower back, initial encounter: Secondary | ICD-10-CM
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
## 2014-03-10 ENCOUNTER — Encounter (HOSPITAL_COMMUNITY): Payer: Self-pay | Admitting: Emergency Medicine

## 2014-05-09 NOTE — L&D Delivery Note (Signed)
Patient is 24 y.o. [id number] [redacted]w[redacted]d admitted for SOL, hx of late prenatal care and borderline msAFP.  Delivery Note At 12:24 PM a viable female was delivered via Vaginal, Spontaneous Delivery (Presentation: ; Occiput Anterior).  APGAR: 7, 9; weight pending. Placenta status: Intact, Spontaneous.  Cord: 3 vessels with the following complications: None.   Anesthesia: None  Episiotomy: None Lacerations: None Est. Blood Loss (mL): 100  Upon arrival to room patient was stating she felt like she needed to push and was in discomfort. Patient checked and was noted to be 10cm dilated and +1 station. She had involuntary pushing and delivered a healthy baby boy. Delivery was precipitous. Baby delivered without difficulty, was noted to have good tone and place on maternal abdomen for oral suctioning, drying and stimulation. Delayed cord clamping performed. Placenta delivered intact with 3V cord. Vaginal canal and perineum were inspected and no lacerations; hemostatic. Pitocin was started and uterus massaged until bleeding slowed. Counts of sharps, instruments, and lap pads were all correct.    Mom to postpartum.  Baby to Couplet care / Skin to Skin.   Caryl Ada, DO 10/21/2014, 12:54 PM PGY-1, [facility]

## 2014-07-14 ENCOUNTER — Other Ambulatory Visit (HOSPITAL_COMMUNITY): Payer: Self-pay | Admitting: Urology

## 2014-07-14 DIAGNOSIS — Z3689 Encounter for other specified antenatal screening: Secondary | ICD-10-CM

## 2014-07-22 ENCOUNTER — Ambulatory Visit (HOSPITAL_COMMUNITY): Payer: No Typology Code available for payment source

## 2014-07-25 ENCOUNTER — Ambulatory Visit (HOSPITAL_COMMUNITY)
Admission: RE | Admit: 2014-07-25 | Discharge: 2014-07-25 | Disposition: A | Discharge status: Home / Self Care | Payer: Self-pay | Source: Ambulatory Visit | Attending: Urology | Admitting: Urology

## 2014-07-25 ENCOUNTER — Other Ambulatory Visit (HOSPITAL_COMMUNITY): Payer: Self-pay | Admitting: Obstetrics & Gynecology

## 2014-07-25 ENCOUNTER — Other Ambulatory Visit (HOSPITAL_COMMUNITY): Payer: Self-pay | Admitting: Physician Assistant

## 2014-07-25 ENCOUNTER — Encounter (HOSPITAL_COMMUNITY): Payer: Self-pay

## 2014-07-25 ENCOUNTER — Other Ambulatory Visit (HOSPITAL_COMMUNITY): Payer: Self-pay | Admitting: Urology

## 2014-07-25 DIAGNOSIS — Z3A26 26 weeks gestation of pregnancy: Secondary | ICD-10-CM | POA: Insufficient documentation

## 2014-07-25 DIAGNOSIS — O280 Abnormal hematological finding on antenatal screening of mother: Secondary | ICD-10-CM | POA: Insufficient documentation

## 2014-07-25 DIAGNOSIS — Z3689 Encounter for other specified antenatal screening: Secondary | ICD-10-CM | POA: Insufficient documentation

## 2014-07-25 DIAGNOSIS — Z36 Encounter for antenatal screening of mother: Secondary | ICD-10-CM | POA: Insufficient documentation

## 2014-07-25 DIAGNOSIS — O0932 Supervision of pregnancy with insufficient antenatal care, second trimester: Secondary | ICD-10-CM | POA: Insufficient documentation

## 2014-10-21 ENCOUNTER — Inpatient Hospital Stay (HOSPITAL_COMMUNITY)
Admission: AD | Admit: 2014-10-21 | Discharge: 2014-10-23 | DRG: 775 | Disposition: A | Discharge status: Home / Self Care | Payer: Medicaid Other | Source: Ambulatory Visit | Attending: Family Medicine | Admitting: Family Medicine

## 2014-10-21 ENCOUNTER — Encounter (HOSPITAL_COMMUNITY): Payer: Self-pay | Admitting: *Deleted

## 2014-10-21 DIAGNOSIS — IMO0001 Reserved for inherently not codable concepts without codable children: Secondary | ICD-10-CM

## 2014-10-21 DIAGNOSIS — O4292 Full-term premature rupture of membranes, unspecified as to length of time between rupture and onset of labor: Principal | ICD-10-CM

## 2014-10-21 DIAGNOSIS — O0933 Supervision of pregnancy with insufficient antenatal care, third trimester: Secondary | ICD-10-CM

## 2014-10-21 DIAGNOSIS — Z3A39 39 weeks gestation of pregnancy: Secondary | ICD-10-CM | POA: Diagnosis present

## 2014-10-21 LAB — CBC
HEMATOCRIT: 33.9 % — AB (ref 36.0–46.0)
Hemoglobin: 11.3 g/dL — ABNORMAL LOW (ref 12.0–15.0)
MCH: 26.2 pg (ref 26.0–34.0)
MCHC: 33.3 g/dL (ref 30.0–36.0)
MCV: 78.7 fL (ref 78.0–100.0)
Platelets: 224 10*3/uL (ref 150–400)
RBC: 4.31 MIL/uL (ref 3.87–5.11)
RDW: 15.1 % (ref 11.5–15.5)
WBC: 7.7 10*3/uL (ref 4.0–10.5)

## 2014-10-21 LAB — TYPE AND SCREEN
ABO/RH(D): O POS
Antibody Screen: NEGATIVE

## 2014-10-21 LAB — RPR: RPR: NONREACTIVE

## 2014-10-21 LAB — OB RESULTS CONSOLE GBS: STREP GROUP B AG: NEGATIVE

## 2014-10-21 MED ORDER — TERBUTALINE SULFATE 1 MG/ML IJ SOLN: 0.2500 mg | Freq: Once | INTRAMUSCULAR | Status: DC | PRN

## 2014-10-21 MED ORDER — DIPHENHYDRAMINE HCL 50 MG/ML IJ SOLN: 12.5000 mg | INTRAMUSCULAR | Status: DC | PRN

## 2014-10-21 MED ORDER — WITCH HAZEL-GLYCERIN EX PADS: 1.0000 "application " | MEDICATED_PAD | CUTANEOUS | Status: DC | PRN

## 2014-10-21 MED ORDER — OXYCODONE-ACETAMINOPHEN 5-325 MG PO TABS: 2.0000 | ORAL_TABLET | ORAL | Status: DC | PRN

## 2014-10-21 MED ORDER — ZOLPIDEM TARTRATE 5 MG PO TABS: 5.0000 mg | ORAL_TABLET | Freq: Every evening | ORAL | Status: DC | PRN

## 2014-10-21 MED ORDER — PRENATAL MULTIVITAMIN CH
1.0000 | ORAL_TABLET | Freq: Every day | ORAL | Status: DC
  Administered 2014-10-22: 1 via ORAL
  Filled 2014-10-21: qty 1

## 2014-10-21 MED ORDER — ONDANSETRON HCL 4 MG PO TABS: 4.0000 mg | ORAL_TABLET | ORAL | Status: DC | PRN

## 2014-10-21 MED ORDER — OXYTOCIN 40 UNITS IN LACTATED RINGERS INFUSION - SIMPLE MED
62.5000 mL/h | INTRAVENOUS | Status: DC
  Administered 2014-10-21: 62.5 mL/h via INTRAVENOUS
  Filled 2014-10-21: qty 1000

## 2014-10-21 MED ORDER — LANOLIN HYDROUS EX OINT: TOPICAL_OINTMENT | CUTANEOUS | Status: DC | PRN

## 2014-10-21 MED ORDER — FENTANYL 2.5 MCG/ML BUPIVACAINE 1/10 % EPIDURAL INFUSION (WH - ANES): 14.0000 mL/h | INTRAMUSCULAR | Status: DC | PRN

## 2014-10-21 MED ORDER — LACTATED RINGERS IV SOLN
INTRAVENOUS | Status: DC
  Administered 2014-10-21 (×2): via INTRAVENOUS

## 2014-10-21 MED ORDER — OXYTOCIN BOLUS FROM INFUSION
500.0000 mL | INTRAVENOUS | Status: DC
  Administered 2014-10-21: 500 mL via INTRAVENOUS

## 2014-10-21 MED ORDER — OXYCODONE-ACETAMINOPHEN 5-325 MG PO TABS
1.0000 | ORAL_TABLET | ORAL | Status: DC | PRN
  Administered 2014-10-21: 1 via ORAL
  Filled 2014-10-21: qty 1

## 2014-10-21 MED ORDER — LIDOCAINE HCL (PF) 1 % IJ SOLN: 30.0000 mL | INTRAMUSCULAR | Status: DC | PRN

## 2014-10-21 MED ORDER — MISOPROSTOL 50MCG HALF TABLET
50.0000 ug | ORAL_TABLET | ORAL | Status: DC | PRN
  Administered 2014-10-21: 50 ug via ORAL
  Filled 2014-10-21: qty 0.5

## 2014-10-21 MED ORDER — TETANUS-DIPHTH-ACELL PERTUSSIS 5-2.5-18.5 LF-MCG/0.5 IM SUSP
0.5000 mL | Freq: Once | INTRAMUSCULAR | Status: DC
  Filled 2014-10-21: qty 0.5

## 2014-10-21 MED ORDER — DIBUCAINE 1 % RE OINT
1.0000 "application " | TOPICAL_OINTMENT | RECTAL | Status: DC | PRN
  Filled 2014-10-21: qty 28

## 2014-10-21 MED ORDER — CITRIC ACID-SODIUM CITRATE 334-500 MG/5ML PO SOLN: 30.0000 mL | ORAL | Status: DC | PRN

## 2014-10-21 MED ORDER — SIMETHICONE 80 MG PO CHEW: 80.0000 mg | CHEWABLE_TABLET | ORAL | Status: DC | PRN

## 2014-10-21 MED ORDER — FENTANYL CITRATE (PF) 100 MCG/2ML IJ SOLN
100.0000 ug | INTRAMUSCULAR | Status: DC | PRN
  Administered 2014-10-21 (×2): 100 ug via INTRAVENOUS
  Filled 2014-10-21 (×2): qty 2

## 2014-10-21 MED ORDER — ONDANSETRON HCL 4 MG/2ML IJ SOLN
4.0000 mg | Freq: Four times a day (QID) | INTRAMUSCULAR | Status: DC | PRN
  Administered 2014-10-21: 4 mg via INTRAVENOUS
  Filled 2014-10-21: qty 2

## 2014-10-21 MED ORDER — EPHEDRINE 5 MG/ML INJ: 10.0000 mg | INTRAVENOUS | Status: DC | PRN

## 2014-10-21 MED ORDER — LACTATED RINGERS IV SOLN: 500.0000 mL | INTRAVENOUS | Status: DC | PRN

## 2014-10-21 MED ORDER — OXYCODONE-ACETAMINOPHEN 5-325 MG PO TABS: 1.0000 | ORAL_TABLET | ORAL | Status: DC | PRN

## 2014-10-21 MED ORDER — IBUPROFEN 600 MG PO TABS
600.0000 mg | ORAL_TABLET | Freq: Four times a day (QID) | ORAL | Status: DC
  Administered 2014-10-21 – 2014-10-23 (×8): 600 mg via ORAL
  Filled 2014-10-21 (×8): qty 1

## 2014-10-21 MED ORDER — PHENYLEPHRINE 40 MCG/ML (10ML) SYRINGE FOR IV PUSH (FOR BLOOD PRESSURE SUPPORT): 80.0000 ug | PREFILLED_SYRINGE | INTRAVENOUS | Status: DC | PRN

## 2014-10-21 MED ORDER — DIPHENHYDRAMINE HCL 25 MG PO CAPS: 25.0000 mg | ORAL_CAPSULE | Freq: Four times a day (QID) | ORAL | Status: DC | PRN

## 2014-10-21 MED ORDER — SENNOSIDES-DOCUSATE SODIUM 8.6-50 MG PO TABS
2.0000 | ORAL_TABLET | ORAL | Status: DC
  Administered 2014-10-22: 2 via ORAL
  Filled 2014-10-21 (×2): qty 2

## 2014-10-21 MED ORDER — BENZOCAINE-MENTHOL 20-0.5 % EX AERO
1.0000 "application " | INHALATION_SPRAY | CUTANEOUS | Status: DC | PRN
  Filled 2014-10-21: qty 56

## 2014-10-21 MED ORDER — ACETAMINOPHEN 325 MG PO TABS: 650.0000 mg | ORAL_TABLET | ORAL | Status: DC | PRN

## 2014-10-21 MED ORDER — ONDANSETRON HCL 4 MG/2ML IJ SOLN: 4.0000 mg | INTRAMUSCULAR | Status: DC | PRN

## 2014-10-21 NOTE — H&P (Signed)
LABOR ADMISSION HISTORY AND PHYSICAL  Brenda Cline is a 24 y.o. female G3P2002 with IUP at [redacted]w[redacted]d by 26 week ultrasound presenting for complaint of labor. Reports she started feeling mild contractions around 2 am that became more regular and painful around 4 AM. She noticed slow leakage of fluid around 4 AM. She reports +FMs, no VB, no blurry vision, headaches or peripheral edema, and RUQ pain.  She plans on breast feeding. She does not desire circumcision for the infant  Dating: By 26 week 5 day ultrasound  --->  Estimated Date of Delivery: 10/26/14  Sono:   , CWD, normal anatomy,  981g, 55% EFW   Prenatal History/Complications: 1. Late prenatal care 2. Borderline msAFP  Past Medical History: Past Medical History  Diagnosis Date  . No pertinent past medical history     Past Surgical History: Past Surgical History  Procedure Laterality Date  . No past surgeries      Obstetrical History: OB History    Gravida Para Term Preterm AB TAB SAB Ectopic Multiple Living   Social History: History   Social History  . Marital Status: Single    Spouse Name: N/A  . Number of Children: N/A  . Years of Education: N/A   Social History Main Topics  . Smoking status: Never Smoker   . Smokeless tobacco: Never Used  . Alcohol Use: No  . Drug Use: No  . Sexual Activity: Yes    Birth Control/ Protection: None   Other Topics Concern  . None   Social History Narrative    Family History: Family History  Problem Relation Age of Onset  . Other Neg Hx   . Hearing loss Neg Hx     Allergies: Allergies  Allergen Reactions  . Penicillins Rash    Prescriptions prior to admission  Medication Sig Dispense Refill Last Dose  . Prenatal Vit-Fe Fumarate-FA (PRENATAL MULTIVITAMIN) TABS Take 1 tablet by mouth daily.   10/20/2014 at Unknown time  . ferrous sulfate 325 (65 FE) MG tablet Take 1 tablet (325 mg total) by mouth 3 (three) times daily with meals.  (Patient not taking: Reported on 07/25/2014) 90 tablet 1 Not Taking  . ibuprofen (ADVIL,MOTRIN) 600 MG tablet Take 1 tablet (600 mg total) by mouth every 6 (six) hours. (Patient not taking: Reported on 07/25/2014) 30 tablet 1 Not Taking  . senna-docusate (SENOKOT-S) 8.6-50 MG per tablet Take 2 tablets by mouth at bedtime. (Patient not taking: Reported on 07/25/2014) 60 tablet 2 Not Taking     Review of Systems   All systems reviewed and negative except as stated in HPI  Blood pressure 108/77, pulse 100, temperature 97.9 F (36.6 C), temperature source Oral, resp. rate 20, height  (1.575 m), weight 82.373 kg (181 lb 9.6 oz), last menstrual period 02/15/2014, unknown if currently breastfeeding. General appearance: alert, cooperative and no distress Lungs: clear to auscultation bilaterally Heart: regular rate and rhythm Abdomen: soft, non-tender; bowel sounds normal Extremities: Homans sign is negative, no sign of DVT Presentation: cephalic Fetal monitoringBaseline: 140 bpm, Variability: Good {> 6 bpm), Accelerations: Reactive and Decelerations: Absent Uterine activity q5-6 minutes, mild  Dilation: 1.5 Effacement (%): 60 Station: -2 Exam by:: DCALLAWAY, RN FERNING negative   Prenatal labs: ABO, Rh:  O positive Antibody:  negative Rubella:  immune RPR:   negative HBsAg:   negative HIV:   negative GBS: Negative (06/14 0000)  1 hr Glucola 90 Genetic screening  Borderline msafp  Anatomy US normal  Prenatal Transfer Tool  Maternal Diabetes: No Genetic Screening: Abnormal:  Results: Other:borderline msAFP, technically normal range Maternal Ultrasounds/Referrals: Normal Fetal Ultrasounds or other Referrals:  None Maternal Substance Abuse:  No Significant Maternal Medications:  None Significant Maternal Lab Results: None  Results for orders placed or performed during the hospital encounter of 10/21/14 (from the past 24 hour(s))  OB RESULT CONSOLE Group B Strep   Collection  Time: 10/21/14 12:00 AM  Result Value Ref Range   GBS Negative     There are no active problems to display for this patient.   Assessment: Brenda Cline is a 24 y.o. G3P2002 at [redacted]w[redacted]d here for active labor   #Labor: Expectant management. Discussed option of watching for increased contraction pattern with cervical change vs. Augmentation of labor. Patient would like to wait several hours and monitor for change. #Pain: Does not desire epidural #FWB: Cat I, continuous monitoring  #ID:  GBS negative #MOF: breast #MOC: undecided #Circ:  None  Patient history, exam, assessment and plan discussed with Dr. Connye Burkitt Jama Flavors 10/21/2014, 5:52 AM   OB fellow attestation:  I have seen and examined this patient; I agree with above documentation in the resident's note.   Brenda Cline is a 24 y.o. [phone] here for term PROM  PE: BP 108/77 mmHg  Pulse 100  Temp(Src) 97.9 F (36.6 C) (Oral)  Resp 20  Ht 5\' 2"  (1.575 m)  Wt 181 lb 9.6 oz (82.373 kg)  BMI 33.21 kg/m2  LMP 02/15/2014 Gen: calm comfortable, NAD Resp: normal effort, no distress Abd: gravid  ROS, labs, PMH reviewed  Plan: #Labor: Term PROM. Patient desires expectant management, but agrees to start pitocin if no cervical change in the next few hours.  #Pain: Labor support without medications #FWB: Category I #GBS: neg #MOF: breast #MOC: undecided #Circ: declines  William Dalton 10/21/2014, 7:19 AM

## 2014-10-21 NOTE — MAU Note (Signed)
PT SAYS SHE HURTS  BAD  SINCE  0430.   VE AT HD LAST Thursday  1  CM.    DENIES HSV AND MRSA.   GBS- NEG

## 2014-10-21 NOTE — Progress Notes (Signed)
Labor Progress Note  Brenda Cline is a 24 y.o. G3P2002 at [redacted]w[redacted]d  admitted for active labor  S: Patient endorsing some pain with contractions. She is mildly uncomfortable with vaginal exams. Patient is amendable to IV pain medications but does not want an epidural.   O:  BP 112/77 mmHg  Pulse 74  Temp(Src) 97.4 F (36.3 C) (Oral)  Resp 18  Ht 5\' 2"  (1.575 m)  Wt 181 lb 9.6 oz (82.373 kg)  BMI 33.21 kg/m2  LMP 02/15/2014 FHT:  FHR: 130 bpm, variability: moderate,  accelerations:  Present,  decelerations:  Present Variable UC:   regular, every 2-4 minutes SVE:   Dilation: 1.5 Effacement (%): 60 Station: -2 Exam by:: DCALLAWAY, RN SROM @0400    Labs: Lab Results  Component Value Date   WBC 7.7 10/21/2014   HGB 11.3* 10/21/2014   HCT 33.9* 10/21/2014   MCV 78.7 10/21/2014   PLT 224 10/21/2014    Assessment / Plan: 24 y.o. G3P2002 [redacted]w[redacted]d in early labor Spontaneous labor, progressing normally  Labor: Progressing normally, will place foley bulb and give PO cytotec for IOL. Fetal Wellbeing:  Category I Pain Control:  Labor support without medications Anticipated MOD:  NSVD  Expectant management   Caryl Ada, DO 10/21/2014, 9:07 AM PGY-1, [facility]

## 2014-10-22 LAB — CBC
HEMATOCRIT: 31.7 % — AB (ref 36.0–46.0)
Hemoglobin: 10.5 g/dL — ABNORMAL LOW (ref 12.0–15.0)
MCH: 26.4 pg (ref 26.0–34.0)
MCHC: 33.1 g/dL (ref 30.0–36.0)
MCV: 79.6 fL (ref 78.0–100.0)
Platelets: 220 10*3/uL (ref 150–400)
RBC: 3.98 MIL/uL (ref 3.87–5.11)
RDW: 15.6 % — ABNORMAL HIGH (ref 11.5–15.5)
WBC: 8.5 10*3/uL (ref 4.0–10.5)

## 2014-10-22 NOTE — Lactation Note (Addendum)
This note was copied from the chart of Brenda Cline. Lactation Consultation Note Initial visit at 33 hours of age.  Baby has had 8 feeding with 3 voids and 3 stool.  Baby is asleep in crib after breastfeeding about 1 hour ago and mom is resting.  [facility] LC resources given and discussed.  Encouraged to feed with early cues on demand.  Early newborn behavior discussed.  Hand expression demonstrated with colostrum visible by mom ealier.  Mom to call for assist as needed.    Patient Name: Brenda Aleena Reh Today's Date: 10/22/2014 Reason for consult: Initial assessment   Maternal Data Has patient been taught Hand Expression?: Yes Does the patient have breastfeeding experience prior to this delivery?: Yes  Feeding    LATCH Score/Interventions                Intervention(s): Breastfeeding basics reviewed     Lactation Tools Discussed/Used WIC Program: Yes   Consult Status Consult Status: Follow-up Date: 10/23/14 Follow-up type: In-patient    [place], [place] 10/22/2014, 9:44 PM

## 2014-10-22 NOTE — Progress Notes (Signed)
Post Partum Day 1 Subjective:  Brenda Cline is a 24 y.o. G3P3003 [redacted]w[redacted]d s/p SVD.  No acute events overnight.  Pt reports mild calf pain with ambulating but has been able to do so. Pain began yesterday not long after SVD and is not associated with swelling, palpitations, chest pain, or SOB. No problems with voiding or po intake.  She denies nausea or vomiting.  Pain is well controlled.  She has had flatus. She has had bowel movement.  Lochia Moderate.  Plan for birth control is undecided.  Method of Feeding: Breast.  Objective: Blood pressure 95/66, pulse 70, temperature 98.1 F (36.7 C), temperature source Oral, resp. rate 18, height 5\' 2"  (1.575 m), weight 82.373 kg (181 lb 9.6 oz), last menstrual period 02/15/2014, SpO2 100 %, unknown if currently breastfeeding.  Physical Exam:  General: alert, cooperative and no distress Lochia:normal flow Chest: CTAB Heart: RRR no m/r/g Abdomen: +BS, soft, nontender Uterine Fundus: firm, palpable 1 finger below the umbilicus DVT Evaluation: whole length calf tenderness bilaterally without erythema, swelling, or warmth. Extremities: No edema   Recent Labs  10/21/14 0614 10/22/14 0535  HGB 11.3* 10.5*  HCT 33.9* 31.7*    Assessment/Plan:  ASSESSMENT: Brenda Cline is a 24 y.o. G3P3003 [redacted]w[redacted]d s/p SVD. Patient continues to recover well. Calf pain is most likely post-partum soreness due to lack of erythema, swelling, and warmth and that it is bilateral. Will closely monitor for signs of PE.  Plan for discharge tomorrow   LOS: 1 day   Fleeta Emmer 10/22/2014, 7:33 AM

## 2014-10-22 NOTE — Progress Notes (Signed)
UR chart review completed.  

## 2014-10-23 MED ORDER — IBUPROFEN 600 MG PO TABS: 600.0000 mg | ORAL_TABLET | Freq: Four times a day (QID) | ORAL | Status: DC | PRN

## 2014-10-23 NOTE — Lactation Note (Signed)
This note was copied from the chart of Brenda Cline. Lactation Consultation Note  Patient Name: Brenda Cline Date: 10/23/2014 Reason for consult: Follow-up assessment with this mom and baby, now 44 hours  O,d. Mom reports breast feeding going well, and she has no concerns or questions at this time. Mom knows to call if she does, to lactation.    Maternal Data    Feeding Feeding Type: Breast Fed  LATCH Score/Interventions Latch: Grasps breast easily, tongue down, lips flanged, rhythmical sucking.  Audible Swallowing: Spontaneous and intermittent  Type of Nipple: Everted at rest and after stimulation  Comfort (Breast/Nipple): Soft / non-tender     Hold (Positioning): No assistance needed to correctly position infant at breast.  LATCH Score: 10  Lactation Tools Discussed/Used     Consult Status Consult Status: Complete    Alfred Levins 10/23/2014, 9:23 AM

## 2014-10-23 NOTE — Discharge Instructions (Signed)

## 2014-10-23 NOTE — Discharge Summary (Signed)
Obstetric Discharge Summary Reason for Admission: rupture of membranes followed by aug of PO cytotec x 1 dose which progressed to SVD Prenatal Procedures: NST and ultrasound Intrapartum Procedures: spontaneous vaginal delivery Postpartum Procedures: none Complications-Operative and Postpartum: none  Patient is 24 y.o. [id number] [redacted]w[redacted]d admitted for PROM, hx of late prenatal care and borderline msAFP.  Delivery Note At 12:24 PM a viable female was delivered via Vaginal, Spontaneous Delivery (Presentation: ; Occiput Anterior). APGAR: 7, 9; weight pending. Placenta status: Intact, Spontaneous. Cord: 3 vessels with the following complications: None.   Anesthesia: None  Episiotomy: None Lacerations: None Est. Blood Loss (mL): 100  Upon arrival to room patient was stating she felt like she needed to push and was in discomfort. Patient checked and was noted to be 10cm dilated and +1 station. She had involuntary pushing and delivered a healthy baby boy. Delivery was precipitous. Baby delivered without difficulty, was noted to have good tone and place on maternal abdomen for oral suctioning, drying and stimulation. Delayed cord clamping performed. Placenta delivered intact with 3V cord. Vaginal canal and perineum were inspected and no lacerations; hemostatic. Pitocin was started and uterus massaged until bleeding slowed. Counts of sharps, instruments, and lap pads were all correct.    Mom to postpartum. Baby to Couplet care / Skin to Skin.  Hospital Course:  Active Problems:   Active labor   Brenda Cline is a 23 y.o. G3P3003 s/p SVD.  Patient was admitted w/ PROM.  She has postpartum course that was uncomplicated. The pt feels ready to go home and  will be discharged with outpatient follow-up.   Today: No acute events overnight.  Pt denies problems with ambulating, voiding or po intake. Leg pain from yesterday has resolved.  She denies nausea or vomiting.  Pain is well controlled.  She has  had flatus. She has had bowel movement.  Lochia Small.  Plan for birth control is  IUD.  Method of Feeding: Breast  Physical Exam:  General: alert, cooperative and no distress Lochia: appropriate Uterine Fundus: firm DVT Evaluation: No evidence of DVT seen on physical exam.  H/H: Lab Results  Component Value Date/Time   HGB 10.5* 10/22/2014 05:35 AM   HCT 31.7* 10/22/2014 05:35 AM    Discharge Diagnoses: Term Pregnancy-delivered  Discharge Information: Date: 10/23/2014 Activity: pelvic rest Diet: routine  Medications: Ibuprofen Breast feeding:  Yes Condition: stable Instructions: refer to handout Discharge to: home      Medication List    TAKE these medications        ibuprofen 600 MG tablet  Commonly known as:  ADVIL,MOTRIN  Take 1 tablet (600 mg total) by mouth every 6 (six) hours as needed.     prenatal multivitamin Tabs tablet  Take 1 tablet by mouth daily.           Follow-up Information    Follow up with [facility] [place]. Schedule an appointment as soon as possible for a visit in 4 weeks.   Why:  For your postpartum appointment.   Contact information:   [address] [place] [address] [phone]      Fleeta Emmer ,MS3 10/23/2014,4:13 PM   CNM attestation I have seen and examined this patient and agree with above documentation in the med student's note.   Brenda Cline is a 24 y.o. [phone] s/p SVD.   Pain is well controlled.  Plan for birth control is IUD.  Method of Feeding: breast  PE:  BP  105/72 mmHg  Pulse 77  Temp(Src) 97.6 F (36.4 C) (Oral)  Resp 19  Ht  (1.575 m)  Wt 82.373 kg (181 lb 9.6 oz)  BMI 33.21 kg/m2  SpO2 100%  LMP 02/15/2014  Breastfeeding? Unknown Heart: RRR Lungs: nl effort Fundus firm   Recent Labs  10/22/14 0535  HGB 10.5*  HCT 31.7*     Plan: discharge today - postpartum care discussed - f/u clinic in 4-6 weeks for postpartum visit   Jacorian Golaszewski,  CNM 1:15 AM

## 2015-05-19 ENCOUNTER — Encounter (HOSPITAL_COMMUNITY): Payer: Self-pay | Admitting: Emergency Medicine

## 2015-05-19 ENCOUNTER — Emergency Department (HOSPITAL_COMMUNITY)
Admission: EM | Admit: 2015-05-19 | Discharge: 2015-05-19 | Disposition: A | Discharge status: Home / Self Care | Payer: Medicaid Other | Attending: Emergency Medicine | Admitting: Emergency Medicine

## 2015-05-19 DIAGNOSIS — Z79899 Other long term (current) drug therapy: Secondary | ICD-10-CM | POA: Insufficient documentation

## 2015-05-19 DIAGNOSIS — Z880 Allergy status to penicillin: Secondary | ICD-10-CM | POA: Insufficient documentation

## 2015-05-19 DIAGNOSIS — H6591 Unspecified nonsuppurative otitis media, right ear: Secondary | ICD-10-CM | POA: Insufficient documentation

## 2015-05-19 MED ORDER — OXYCODONE-ACETAMINOPHEN 5-325 MG PO TABS: 1.0000 | ORAL_TABLET | ORAL | Status: DC | PRN

## 2015-05-19 MED ORDER — OXYCODONE-ACETAMINOPHEN 5-325 MG PO TABS
1.0000 | ORAL_TABLET | Freq: Once | ORAL | Status: AC
  Administered 2015-05-19: 1 via ORAL
  Filled 2015-05-19: qty 1

## 2015-05-19 MED ORDER — CEFUROXIME AXETIL 500 MG PO TABS: 500.0000 mg | ORAL_TABLET | Freq: Two times a day (BID) | ORAL | Status: DC

## 2015-05-19 MED ORDER — CEFUROXIME AXETIL 500 MG PO TABS
500.0000 mg | ORAL_TABLET | Freq: Two times a day (BID) | ORAL | Status: DC
  Administered 2015-05-19: 500 mg via ORAL
  Filled 2015-05-19 (×3): qty 1

## 2015-05-19 NOTE — ED Provider Notes (Signed)
CSN: [id number]     Arrival date & time 05/19/15  0426 History   First MD Initiated Contact with Patient 05/19/15 [phone]     Chief Complaint  Patient presents with  . Otalgia     (Consider location/radiation/quality/duration/timing/severity/associated sxs/prior Treatment) Patient is a 25 y.o. female presenting with ear pain. The history is provided by the patient.  Otalgia She complains of severe right-sided ear pain which started yesterday. She had been having a cold for the prior 3 days with nasal congestion. She denies fever, chills, sweats. There is decreased hearing in her ear. She has taken ibuprofen for pain without relief. She rates pain at 10/10.  Past Medical History  Diagnosis Date  . No pertinent past medical history    Past Surgical History  Procedure Laterality Date  . No past surgeries     Family History  Problem Relation Age of Onset  . Other Neg Hx   . Hearing loss Neg Hx    Social History  Substance Use Topics  . Smoking status: Never Smoker   . Smokeless tobacco: Never Used  . Alcohol Use: No   OB History    Gravida Para Term Preterm AB TAB SAB Ectopic Multiple Living   3 3 3  0 0 0 0 0 0 3     Review of Systems  HENT: Positive for ear pain.   All other systems reviewed and are negative.     Allergies  Penicillins  Home Medications   Prior to Admission medications   Medication Sig Start Date End Date Taking? Authorizing Provider  ibuprofen (ADVIL,MOTRIN) 600 MG tablet Take 1 tablet (600 mg total) by mouth every 6 (six) hours as needed. 10/23/14   Arabella MerlesKimberly D Shaw, CNM  Prenatal Vit-Fe Fumarate-FA (PRENATAL MULTIVITAMIN) TABS Take 1 tablet by mouth daily.    Historical Provider, MD   BP 114/79 mmHg  Pulse 94  Temp(Src) 98 F (36.7 C) (Oral)  Resp 16  Ht 5\' 2"  (1.575 m)  Wt 170 lb (77.111 kg)  BMI 31.09 kg/m2 Physical Exam  Nursing note and vitals reviewed.  25 year old female, resting comfortably and in no acute distress. Vital signs are  normal.  Head is normocephalic and atraumatic. PERRLA, EOMI. Oropharynx is clear. Left tympanic membrane is normal. Right tympanic membrane is erythematous with significant bulging and loss of visibility of landmarks of the middle ear. Neck is nontender and supple without adenopathy or JVD. Back is nontender and there is no CVA tenderness. Lungs are clear without rales, wheezes, or rhonchi. Chest is nontender. Heart has regular rate and rhythm without murmur. Abdomen is soft, flat, nontender without masses or hepatosplenomegaly and peristalsis is normoactive. Extremities have no cyanosis or edema, full range of motion is present. Skin is warm and dry without rash. Neurologic: Mental status is normal, cranial nerves are intact, there are no motor or sensory deficits.  ED Course  Procedures (including critical care time)  MDM   Final diagnoses:  Right otitis media with effusion    Right otitis media. She is allergic to penicillin. She is discharged with prescriptions for cefuroxime and oxycodone-acetaminophen.    Dione Boozeavid Rylie Knierim, MD 05/19/15 [phone]

## 2015-05-19 NOTE — Discharge Instructions (Signed)
Otitis Media, Adult Otitis media is redness, soreness, and inflammation of the middle ear. Otitis media may be caused by allergies or, most commonly, by infection. Often it occurs as a complication of the common cold. SIGNS AND SYMPTOMS Symptoms of otitis media may include:  Earache.  Fever.  Ringing in your ear.  Headache.  Leakage of fluid from the ear. DIAGNOSIS To diagnose otitis media, your health care provider will examine your ear with an otoscope. This is an instrument that allows your health care provider to see into your ear in order to examine your eardrum. Your health care provider also will ask you questions about your symptoms. TREATMENT  Typically, otitis media resolves on its own within 3-5 days. Your health care provider may prescribe medicine to ease your symptoms of pain. If otitis media does not resolve within 5 days or is recurrent, your health care provider may prescribe antibiotic medicines if he or she suspects that a bacterial infection is the cause. HOME CARE INSTRUCTIONS   If you were prescribed an antibiotic medicine, finish it all even if you start to feel better.  Take medicines only as directed by your health care provider.  Keep all follow-up visits as directed by your health care provider. SEEK MEDICAL CARE IF:  You have otitis media only in one ear, or bleeding from your nose, or both.  You notice a lump on your neck.  You are not getting better in 3-5 days.  You feel worse instead of better. SEEK IMMEDIATE MEDICAL CARE IF:   You have pain that is not controlled with medicine.  You have swelling, redness, or pain around your ear or stiffness in your neck.  You notice that part of your face is paralyzed.  You notice that the bone behind your ear (mastoid) is tender when you touch it. MAKE SURE YOU:   Understand these instructions.  Will watch your condition.  Will get help right away if you are not doing well or get worse.   This  information is not intended to replace advice given to you by your health care provider. Make sure you discuss any questions you have with your health care provider.   Document Released: 01/29/2004 Document Revised: 05/16/2014 Document Reviewed: 11/20/2012 Elsevier Interactive Patient Education 2016 Elsevier Inc.  Cefuroxime tablets What is this medicine? CEFUROXIME (se fyoor OX eem) is a cephalosporin antibiotic. It is used to treat certain kinds of bacterial infections. It will not work for colds, flu, or other viral infections. This medicine may be used for other purposes; ask your health care provider or pharmacist if you have questions. What should I tell my health care provider before I take this medicine? They need to know if you have any of these conditions: -bleeding problems -bowel disease, like colitis -kidney disease -liver disease -an unusual or allergic reaction to cefuroxime, other antibiotics or medicines, foods, dyes or preservatives -pregnant or trying to get pregnant -breast-feeding How should I use this medicine? Take this medicine by mouth with a full glass of water. Follow the directions on the prescription label. Do not crush or chew. This medicine works best if you take it with food. Take your medicine at regular intervals. Do not take your medicine more often than directed. Take all of your medicine as directed even if you think your are better. Do not skip doses or stop your medicine early. Talk to your pediatrician regarding the use of this medicine in children. Special care may be needed. While  this drug may be prescribed for children as young as 30 months of age for selected conditions, precautions do apply. Overdosage: If you think you have taken too much of this medicine contact a poison control center or emergency room at once. NOTE: This medicine is only for you. Do not share this medicine with others. What if I miss a dose? If you miss a dose, take it as soon  as you can. If it is almost time for your next dose, take only that dose. Do not take double or extra doses. What may interact with this medicine? This medicine may interact with the following medications: -antacids -birth control pills -certain medicines for infection like amikacin, gentamicin, tobramycin -diuretics -probenecid -warfarin This list may not describe all possible interactions. Give your health care provider a list of all the medicines, herbs, non-prescription drugs, or dietary supplements you use. Also tell them if you smoke, drink alcohol, or use illegal drugs. Some items may interact with your medicine. What should I watch for while using this medicine? Tell your doctor or health care professional if your symptoms do not improve or if you get new symptoms. Do not treat diarrhea with over the counter products. Contact your doctor if you have diarrhea that lasts more than 2 days or if it is severe and watery. This medicine can interfere with some urine glucose tests. If you use such tests, talk with your health care professional. If you are being treated for a sexually transmitted disease, avoid sexual contact until you have finished your treatment. Your sexual partner may also need treatment. What side effects may I notice from receiving this medicine? Side effects that you should report to your doctor or health care professional as soon as possible: -allergic reactions like skin rash, itching or hives, swelling of the face, lips, or tongue -dark urine -difficulty breathing -fever -irregular heartbeat or chest pain -redness, blistering, peeling or loosening of the skin, including inside the mouth -seizures -unusual bleeding or bruising -unusually weak or tired -white patches or sores in the mouth Side effects that usually do not require medical attention (report to your doctor or health care professional if they continue or are bothersome): -diarrhea -gas or  heartburn -headache -nausea, vomiting -vaginal itching This list may not describe all possible side effects. Call your doctor for medical advice about side effects. You may report side effects to FDA at 1-800-FDA-1088. Where should I keep my medicine? Keep out of the reach of children. Store at room temperature between 15 and 30 degrees C (59 and 86 degrees F). Keep container tightly closed. Protect from moisture. Throw away any unused medicine after the expiration date. NOTE: This sheet is a summary. It may not cover all possible information. If you have questions about this medicine, talk to your doctor, pharmacist, or health care provider.    2016, Elsevier/Gold Standard. (2013-03-11 09:43:18)  Acetaminophen; Oxycodone tablets What is this medicine? ACETAMINOPHEN; OXYCODONE (a set a MEE noe fen; ox i KOE done) is a pain reliever. It is used to treat moderate to severe pain. This medicine may be used for other purposes; ask your health care provider or pharmacist if you have questions. What should I tell my health care provider before I take this medicine? They need to know if you have any of these conditions: -brain tumor -Crohn's disease, inflammatory bowel disease, or ulcerative colitis -drug abuse or addiction -head injury -heart or circulation problems -if you often drink alcohol -kidney disease or problems going  to the bathroom -liver disease -lung disease, asthma, or breathing problems -an unusual or allergic reaction to acetaminophen, oxycodone, other opioid analgesics, other medicines, foods, dyes, or preservatives -pregnant or trying to get pregnant -breast-feeding How should I use this medicine? Take this medicine by mouth with a full glass of water. Follow the directions on the prescription label. You can take it with or without food. If it upsets your stomach, take it with food. Take your medicine at regular intervals. Do not take it more often than directed. Talk to  your pediatrician regarding the use of this medicine in children. Special care may be needed. Patients over 40 years old may have a stronger reaction and need a smaller dose. Overdosage: If you think you have taken too much of this medicine contact a poison control center or emergency room at once. NOTE: This medicine is only for you. Do not share this medicine with others. What if I miss a dose? If you miss a dose, take it as soon as you can. If it is almost time for your next dose, take only that dose. Do not take double or extra doses. What may interact with this medicine? -alcohol -antihistamines -barbiturates like amobarbital, butalbital, butabarbital, methohexital, pentobarbital, phenobarbital, thiopental, and secobarbital -benztropine -drugs for bladder problems like solifenacin, trospium, oxybutynin, tolterodine, hyoscyamine, and methscopolamine -drugs for breathing problems like ipratropium and tiotropium -drugs for certain stomach or intestine problems like propantheline, homatropine methylbromide, glycopyrrolate, atropine, belladonna, and dicyclomine -general anesthetics like etomidate, ketamine, nitrous oxide, propofol, desflurane, enflurane, halothane, isoflurane, and sevoflurane -medicines for depression, anxiety, or psychotic disturbances -medicines for sleep -muscle relaxants -naltrexone -narcotic medicines (opiates) for pain -phenothiazines like perphenazine, thioridazine, chlorpromazine, mesoridazine, fluphenazine, prochlorperazine, promazine, and trifluoperazine -scopolamine -tramadol -trihexyphenidyl This list may not describe all possible interactions. Give your health care provider a list of all the medicines, herbs, non-prescription drugs, or dietary supplements you use. Also tell them if you smoke, drink alcohol, or use illegal drugs. Some items may interact with your medicine. What should I watch for while using this medicine? Tell your doctor or health care  professional if your pain does not go away, if it gets worse, or if you have new or a different type of pain. You may develop tolerance to the medicine. Tolerance means that you will need a higher dose of the medication for pain relief. Tolerance is normal and is expected if you take this medicine for a long time. Do not suddenly stop taking your medicine because you may develop a severe reaction. Your body becomes used to the medicine. This does NOT mean you are addicted. Addiction is a behavior related to getting and using a drug for a non-medical reason. If you have pain, you have a medical reason to take pain medicine. Your doctor will tell you how much medicine to take. If your doctor wants you to stop the medicine, the dose will be slowly lowered over time to avoid any side effects. You may get drowsy or dizzy. Do not drive, use machinery, or do anything that needs mental alertness until you know how this medicine affects you. Do not stand or sit up quickly, especially if you are an older patient. This reduces the risk of dizzy or fainting spells. Alcohol may interfere with the effect of this medicine. Avoid alcoholic drinks. There are different types of narcotic medicines (opiates) for pain. If you take more than one type at the same time, you may have more side effects. Give your health  care provider a list of all medicines you use. Your doctor will tell you how much medicine to take. Do not take more medicine than directed. Call emergency for help if you have problems breathing. The medicine will cause constipation. Try to have a bowel movement at least every 2 to 3 days. If you do not have a bowel movement for 3 days, call your doctor or health care professional. Do not take Tylenol (acetaminophen) or medicines that have acetaminophen with this medicine. Too much acetaminophen can be very dangerous. Many nonprescription medicines contain acetaminophen. Always read the labels carefully to avoid taking  more acetaminophen. What side effects may I notice from receiving this medicine? Side effects that you should report to your doctor or health care professional as soon as possible: -allergic reactions like skin rash, itching or hives, swelling of the face, lips, or tongue -breathing difficulties, wheezing -confusion -light headedness or fainting spells -severe stomach pain -unusually weak or tired -yellowing of the skin or the whites of the eyes Side effects that usually do not require medical attention (report to your doctor or health care professional if they continue or are bothersome): -dizziness -drowsiness -nausea -vomiting This list may not describe all possible side effects. Call your doctor for medical advice about side effects. You may report side effects to FDA at 1-800-FDA-1088. Where should I keep my medicine? Keep out of the reach of children. This medicine can be abused. Keep your medicine in a safe place to protect it from theft. Do not share this medicine with anyone. Selling or giving away this medicine is dangerous and against the law. This medicine may cause accidental overdose and death if it taken by other adults, children, or pets. Mix any unused medicine with a substance like cat litter or coffee grounds. Then throw the medicine away in a sealed container like a sealed bag or a coffee can with a lid. Do not use the medicine after the expiration date. Store at room temperature between 20 and 25 degrees C (68 and 77 degrees F). NOTE: This sheet is a summary. It may not cover all possible information. If you have questions about this medicine, talk to your doctor, pharmacist, or health care provider.    2016, Elsevier/Gold Standard. (2014-03-26 15:18:46)

## 2015-05-19 NOTE — ED Notes (Signed)
Pt in reporting R sided ear pain, started yesterday morning, reports blood draining from ear. Reports being sick recently

## 2016-02-14 ENCOUNTER — Emergency Department (HOSPITAL_COMMUNITY): Payer: Self-pay

## 2016-02-14 ENCOUNTER — Encounter (HOSPITAL_COMMUNITY): Payer: Self-pay

## 2016-02-14 ENCOUNTER — Emergency Department (HOSPITAL_COMMUNITY)
Admission: EM | Admit: 2016-02-14 | Discharge: 2016-02-14 | Disposition: A | Discharge status: Home / Self Care | Payer: Self-pay | Attending: Emergency Medicine | Admitting: Emergency Medicine

## 2016-02-14 DIAGNOSIS — N12 Tubulo-interstitial nephritis, not specified as acute or chronic: Secondary | ICD-10-CM | POA: Insufficient documentation

## 2016-02-14 DIAGNOSIS — N898 Other specified noninflammatory disorders of vagina: Secondary | ICD-10-CM | POA: Insufficient documentation

## 2016-02-14 DIAGNOSIS — R1031 Right lower quadrant pain: Secondary | ICD-10-CM

## 2016-02-14 LAB — URINALYSIS, ROUTINE W REFLEX MICROSCOPIC
Bilirubin Urine: NEGATIVE
Glucose, UA: NEGATIVE mg/dL
HGB URINE DIPSTICK: NEGATIVE
KETONES UR: NEGATIVE mg/dL
Nitrite: POSITIVE — AB
PROTEIN: NEGATIVE mg/dL
Specific Gravity, Urine: 1.01 (ref 1.005–1.030)
pH: 6.5 (ref 5.0–8.0)

## 2016-02-14 LAB — COMPREHENSIVE METABOLIC PANEL
ALK PHOS: 119 U/L (ref 38–126)
ALT: 121 U/L — AB (ref 14–54)
AST: 71 U/L — ABNORMAL HIGH (ref 15–41)
Albumin: 3.8 g/dL (ref 3.5–5.0)
Anion gap: 14 (ref 5–15)
BUN: 13 mg/dL (ref 6–20)
CALCIUM: 9.1 mg/dL (ref 8.9–10.3)
CO2: 18 mmol/L — ABNORMAL LOW (ref 22–32)
CREATININE: 0.53 mg/dL (ref 0.44–1.00)
Chloride: 107 mmol/L (ref 101–111)
GFR calc Af Amer: >60 mL/min (ref >60)
Glucose, Bld: 91 mg/dL (ref 65–99)
Potassium: 4.2 mmol/L (ref 3.5–5.1)
Sodium: 139 mmol/L (ref 135–145)
Total Bilirubin: 0.1 mg/dL — ABNORMAL LOW (ref 0.3–1.2)
Total Protein: 7.6 g/dL (ref 6.5–8.1)

## 2016-02-14 LAB — CBC
HCT: 42.7 % (ref 36.0–46.0)
Hemoglobin: 13.9 g/dL (ref 12.0–15.0)
MCH: 29.2 pg (ref 26.0–34.0)
MCHC: 32.6 g/dL (ref 30.0–36.0)
MCV: 89.7 fL (ref 78.0–100.0)
PLATELETS: 275 10*3/uL (ref 150–400)
RBC: 4.76 MIL/uL (ref 3.87–5.11)
RDW: 13.1 % (ref 11.5–15.5)
WBC: 9.3 10*3/uL (ref 4.0–10.5)

## 2016-02-14 LAB — URINE MICROSCOPIC-ADD ON: RBC / HPF: NONE SEEN RBC/hpf (ref 0–5)

## 2016-02-14 LAB — LIPASE, BLOOD: Lipase: 32 U/L (ref 11–51)

## 2016-02-14 LAB — I-STAT BETA HCG BLOOD, ED (MC, WL, AP ONLY): I-stat hCG, quantitative: <5 m[IU]/mL (ref <5)

## 2016-02-14 MED ORDER — AZITHROMYCIN 250 MG PO TABS
1000.0000 mg | ORAL_TABLET | Freq: Once | ORAL | Status: AC
  Administered 2016-02-14: 1000 mg via ORAL
  Filled 2016-02-14: qty 4

## 2016-02-14 MED ORDER — HYDROCODONE-IBUPROFEN 7.5-200 MG PO TABS: 1.0000 | ORAL_TABLET | Freq: Four times a day (QID) | ORAL | 0 refills | Status: DC | PRN

## 2016-02-14 MED ORDER — DEXTROSE 5 % IV SOLN
1.0000 g | Freq: Once | INTRAVENOUS | Status: AC
  Administered 2016-02-14: 1 g via INTRAVENOUS
  Filled 2016-02-14: qty 10

## 2016-02-14 MED ORDER — CEPHALEXIN 500 MG PO CAPS: 500.0000 mg | ORAL_CAPSULE | Freq: Three times a day (TID) | ORAL | 0 refills | Status: DC

## 2016-02-14 MED ORDER — SODIUM CHLORIDE 0.9 % IV BOLUS (SEPSIS)
1000.0000 mL | Freq: Once | INTRAVENOUS | Status: AC
  Administered 2016-02-14: 1000 mL via INTRAVENOUS

## 2016-02-14 MED ORDER — ONDANSETRON HCL 4 MG PO TABS: 4.0000 mg | ORAL_TABLET | Freq: Four times a day (QID) | ORAL | 0 refills | Status: DC

## 2016-02-14 MED ORDER — HYDROMORPHONE HCL 1 MG/ML IJ SOLN
0.5000 mg | Freq: Once | INTRAMUSCULAR | Status: AC
  Administered 2016-02-14: 0.5 mg via INTRAVENOUS
  Filled 2016-02-14: qty 1

## 2016-02-14 NOTE — Discharge Instructions (Signed)
Please return to the Emergency room if your symptoms are worsening

## 2016-02-14 NOTE — ED Notes (Signed)
Patient transported to Ultrasound 

## 2016-02-14 NOTE — ED Triage Notes (Signed)
Patient complains of 3-4 days of RLQ abdominal pain with a few episodes of vomiting and diarrhea. States that the pain is sharp and stabbing. No dysuria with same

## 2016-02-14 NOTE — ED Provider Notes (Signed)
[facility]-EMERGENCY DEPT Provider Note   CSN: [id number] Arrival date & time: 02/14/16  0703     History   Chief Complaint No chief complaint on file.   HPI Brenda Cline is a 25 y.o. female who presents with RLQ pain. No significant PMH. She states that over the past 3 days she has had intermittent pain in the RLQ. Today it became constant and severe. It radiates to the R flank. She has associated N/V/D, dysuria, and brown vaginal discharge. She denies fever, chills, chest pain, SOB. She is sexually active but denies unprotected sex. Has an IUD.   HPI  Past Medical History:  Diagnosis Date  . No pertinent past medical history     Patient Active Problem List   Diagnosis Date Noted  . Active labor 10/21/2014    Past Surgical History:  Procedure Laterality Date  . NO PAST SURGERIES      OB History    Gravida Para Term Preterm AB Living   3 3 3  0 0 3   SAB TAB Ectopic Multiple Live Births   0 0 0 0 3       Home Medications    Prior to Admission medications   Medication Sig Start Date End Date Taking? Authorizing Provider  cefUROXime (CEFTIN) 500 MG tablet Take 1 tablet (500 mg total) by mouth 2 (two) times daily with a meal. 05/19/15   Dione Booze, MD  ibuprofen (ADVIL,MOTRIN) 600 MG tablet Take 1 tablet (600 mg total) by mouth every 6 (six) hours as needed. 10/23/14   Arabella Merles, CNM  oxyCODONE-acetaminophen (PERCOCET) 5-325 MG tablet Take 1 tablet by mouth every 4 (four) hours as needed for moderate pain. 05/19/15   Dione Booze, MD  Prenatal Vit-Fe Fumarate-FA (PRENATAL MULTIVITAMIN) TABS Take 1 tablet by mouth daily.    Historical Provider, MD    Family History Family History  Problem Relation Age of Onset  . Other Neg Hx   . Hearing loss Neg Hx     Social History Social History  Substance Use Topics  . Smoking status: Never Smoker  . Smokeless tobacco: Never Used  . Alcohol use No     Allergies   Penicillins   Review of Systems Review of  Systems  Constitutional: Negative for chills and fever.  Respiratory: Negative for shortness of breath.   Cardiovascular: Negative for chest pain.  Gastrointestinal: Positive for abdominal pain, diarrhea, nausea and vomiting.  Genitourinary: Positive for dysuria, flank pain and vaginal discharge. Negative for vaginal bleeding.  All other systems reviewed and are negative.    Physical Exam Updated Vital Signs BP 113/82   Pulse 76   Temp 98.7 F (37.1 C) (Oral)   Resp 18   SpO2 98%   Physical Exam  Constitutional: She is oriented to person, place, and time. She appears well-developed and well-nourished. No distress.  HENT:  Head: Normocephalic and atraumatic.  Eyes: Conjunctivae are normal. Pupils are equal, round, and reactive to light. Right eye exhibits no discharge. Left eye exhibits no discharge. No scleral icterus.  Neck: Normal range of motion. Neck supple.  Cardiovascular: Normal rate and regular rhythm.  Exam reveals no gallop and no friction rub.   No murmur heard. Pulmonary/Chest: Effort normal and breath sounds normal. No respiratory distress. She has no wheezes. She has no rales. She exhibits no tenderness.  Abdominal: Soft. Bowel sounds are normal. She exhibits no distension and no mass. There is tenderness. There is no rebound and no guarding.  No hernia.  RLQ tenderness  Genitourinary:  Genitourinary Comments: No inguinal lymphadenopathy or inguinal hernia noted. Normal external genitalia. No pain with speculum insertion. Closed cervical os with reddish lesion in 12 to 2 o clock position. There is a mucous like discharge coming from the cervix and in the vaginal vault. On bimanual examination there is discomfort but no obvious CMT. Chaperone (RN Merton Border) present during exam.    Musculoskeletal: She exhibits no edema.  Neurological: She is alert and oriented to person, place, and time.  Skin: Skin is warm and dry.  Psychiatric: She has a normal mood and affect. Her  behavior is normal.  Nursing note and vitals reviewed.    ED Treatments / Results  Labs (all labs ordered are listed, but only abnormal results are displayed) Labs Reviewed  COMPREHENSIVE METABOLIC PANEL - Abnormal; Notable for the following:       Result Value   CO2 18 (*)    AST 71 (*)    ALT 121 (*)    Total Bilirubin 0.1 (*)    All other components within normal limits  URINALYSIS, ROUTINE W REFLEX MICROSCOPIC (NOT AT [facility]) - Abnormal; Notable for the following:    APPearance HAZY (*)    Nitrite POSITIVE (*)    Leukocytes, UA MODERATE (*)    All other components within normal limits  URINE MICROSCOPIC-ADD ON - Abnormal; Notable for the following:    Squamous Epithelial / LPF 6-30 (*)    Bacteria, UA MANY (*)    All other components within normal limits  URINE CULTURE  WET PREP, GENITAL  LIPASE, BLOOD  CBC  I-STAT BETA HCG BLOOD, ED ([facility], [facility], [facility] ONLY)  GC/CHLAMYDIA PROBE AMP ([place]) NOT AT [facility]  WET PREP  (BD AFFIRM) ([place])    EKG  EKG Interpretation None       Radiology No results found.  Procedures Procedures (including critical care time)  Medications Ordered in ED Medications  HYDROmorphone (DILAUDID) injection 0.5 mg (0.5 mg Intravenous Given 02/14/16 0929)  sodium chloride 0.9 % bolus 1,000 mL (0 mLs Intravenous Stopped 02/14/16 1030)  cefTRIAXone (ROCEPHIN) 1 g in dextrose 5 % 50 mL IVPB (0 g Intravenous Stopped 02/14/16 1305)  HYDROmorphone (DILAUDID) injection 0.5 mg (0.5 mg Intravenous Given 02/14/16 1405)  azithromycin (ZITHROMAX) tablet 1,000 mg (1,000 mg Oral Given 02/14/16 1528)     Initial Impression / Assessment and Plan / ED Course  I have reviewed the triage vital signs and the nursing notes.  Pertinent labs & imaging results that were available during my care of the patient were reviewed by me and considered in my medical decision making (see chart for details).  Clinical Course   25 year old female presents with symptoms  consistent with pyelonephritis. Patient is afebrile, not tachycardic or tachypneic, normotensive, and not hypoxic. CBC unremarkable. CMP remarkable for mildly elevated transaminases however she is not tender in RUQ. UA remarkable for infected urine with many bacteria, moderate leukocytes, and +nitrites. Culture sent. CT Renal is remarkable for R hydronephrosis without stranding or stone. Rocephin given in ED as well as rx for Keflex. Azithromycin given to cover STI since she did have significant discharge on exam. Pelvic US was negative for acute pathology and preg test negative. Will d/c with antibiotics and pain medication and antiemetics. Strict return precautions given since patient does not have a PCP.  Final Clinical Impressions(s) / ED Diagnoses   Final diagnoses:  RLQ abdominal pain  Pyelonephritis  Vaginal  discharge    New Prescriptions Discharge Medication List as of 02/14/2016  3:28 PM    START taking these medications   Details  cephALEXin (KEFLEX) 500 MG capsule Take 1 capsule (500 mg total) by mouth 3 (three) times daily., Starting Sun 02/14/2016, Print    HYDROcodone-ibuprofen (VICOPROFEN) 7.5-200 MG tablet Take 1 tablet by mouth every 6 (six) hours as needed for moderate pain., Starting Sun 02/14/2016, Print    ondansetron (ZOFRAN) 4 MG tablet Take 1 tablet (4 mg total) by mouth every 6 (six) hours., Starting Sun 02/14/2016, Print         Bethel BornKelly Marie Sanay Belmar, PA-C 02/14/16 2113    Pricilla LovelessScott Goldston, MD 02/19/16 [phone]

## 2016-02-15 LAB — URINE CULTURE

## 2016-02-15 LAB — GC/CHLAMYDIA PROBE AMP (~~LOC~~) NOT AT ARMC
CHLAMYDIA, DNA PROBE: NEGATIVE
NEISSERIA GONORRHEA: NEGATIVE

## 2016-11-14 ENCOUNTER — Emergency Department (HOSPITAL_COMMUNITY): Payer: Self-pay

## 2016-11-14 ENCOUNTER — Encounter (HOSPITAL_COMMUNITY): Payer: Self-pay | Admitting: *Deleted

## 2016-11-14 ENCOUNTER — Emergency Department (HOSPITAL_COMMUNITY)
Admission: EM | Admit: 2016-11-14 | Discharge: 2016-11-14 | Disposition: A | Discharge status: Home / Self Care | Payer: Self-pay | Attending: Physician Assistant | Admitting: Physician Assistant

## 2016-11-14 DIAGNOSIS — Z79899 Other long term (current) drug therapy: Secondary | ICD-10-CM | POA: Insufficient documentation

## 2016-11-14 DIAGNOSIS — Y92410 Unspecified street and highway as the place of occurrence of the external cause: Secondary | ICD-10-CM | POA: Insufficient documentation

## 2016-11-14 DIAGNOSIS — Y999 Unspecified external cause status: Secondary | ICD-10-CM | POA: Insufficient documentation

## 2016-11-14 DIAGNOSIS — M545 Low back pain: Secondary | ICD-10-CM | POA: Insufficient documentation

## 2016-11-14 DIAGNOSIS — M25572 Pain in left ankle and joints of left foot: Secondary | ICD-10-CM | POA: Insufficient documentation

## 2016-11-14 DIAGNOSIS — M25512 Pain in left shoulder: Secondary | ICD-10-CM | POA: Insufficient documentation

## 2016-11-14 DIAGNOSIS — Y939 Activity, unspecified: Secondary | ICD-10-CM | POA: Insufficient documentation

## 2016-11-14 MED ORDER — IBUPROFEN 400 MG PO TABS
400.0000 mg | ORAL_TABLET | Freq: Once | ORAL | Status: AC | PRN
  Administered 2016-11-14: 400 mg via ORAL
  Filled 2016-11-14: qty 1

## 2016-11-14 MED ORDER — IBUPROFEN 800 MG PO TABS: 800.0000 mg | ORAL_TABLET | Freq: Three times a day (TID) | ORAL | 0 refills | Status: DC

## 2016-11-14 MED ORDER — CYCLOBENZAPRINE HCL 10 MG PO TABS: 10.0000 mg | ORAL_TABLET | Freq: Two times a day (BID) | ORAL | 0 refills | Status: DC | PRN

## 2016-11-14 NOTE — ED Notes (Signed)
Pt got up and walked but is c/o left ankle pain.
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
## 2019-10-19 ENCOUNTER — Emergency Department (HOSPITAL_COMMUNITY): Payer: Self-pay

## 2019-10-19 ENCOUNTER — Encounter (HOSPITAL_COMMUNITY): Payer: Self-pay | Admitting: Family Medicine

## 2019-10-19 ENCOUNTER — Other Ambulatory Visit: Payer: Self-pay

## 2019-10-19 ENCOUNTER — Emergency Department (HOSPITAL_COMMUNITY)
Admission: AD | Admit: 2019-10-19 | Discharge: 2019-10-19 | Disposition: A | Discharge status: Home / Self Care | Payer: Self-pay | Attending: Family Medicine | Admitting: Family Medicine

## 2019-10-19 DIAGNOSIS — T8331XA Breakdown (mechanical) of intrauterine contraceptive device, initial encounter: Secondary | ICD-10-CM | POA: Insufficient documentation

## 2019-10-19 DIAGNOSIS — O4690 Antepartum hemorrhage, unspecified, unspecified trimester: Secondary | ICD-10-CM

## 2019-10-19 DIAGNOSIS — Z331 Pregnant state, incidental: Secondary | ICD-10-CM

## 2019-10-19 DIAGNOSIS — Z3A00 Weeks of gestation of pregnancy not specified: Secondary | ICD-10-CM

## 2019-10-19 DIAGNOSIS — O0281 Inappropriate change in quantitative human chorionic gonadotropin (hCG) in early pregnancy: Secondary | ICD-10-CM

## 2019-10-19 DIAGNOSIS — O039 Complete or unspecified spontaneous abortion without complication: Secondary | ICD-10-CM | POA: Insufficient documentation

## 2019-10-19 DIAGNOSIS — O209 Hemorrhage in early pregnancy, unspecified: Secondary | ICD-10-CM

## 2019-10-19 LAB — HCG, QUANTITATIVE, PREGNANCY: hCG, Beta Chain, Quant, S: 26046 m[IU]/mL — ABNORMAL HIGH (ref <5)

## 2019-10-19 LAB — URINALYSIS, ROUTINE W REFLEX MICROSCOPIC
Bacteria, UA: NONE SEEN
Bilirubin Urine: NEGATIVE
Glucose, UA: NEGATIVE mg/dL
Ketones, ur: NEGATIVE mg/dL
Leukocytes,Ua: NEGATIVE
Nitrite: NEGATIVE
Protein, ur: 30 mg/dL — AB
RBC / HPF: >50 RBC/hpf — ABNORMAL HIGH (ref 0–5)
Specific Gravity, Urine: 1.01 (ref 1.005–1.030)
pH: 8 (ref 5.0–8.0)

## 2019-10-19 LAB — TYPE AND SCREEN
ABO/RH(D): O POS
Antibody Screen: NEGATIVE

## 2019-10-19 LAB — CBC WITH DIFFERENTIAL/PLATELET
Abs Immature Granulocytes: 0.03 10*3/uL (ref 0.00–0.07)
Basophils Absolute: 0 10*3/uL (ref 0.0–0.1)
Basophils Relative: 0 %
Eosinophils Absolute: 0.1 10*3/uL (ref 0.0–0.5)
Eosinophils Relative: 1 %
HCT: 39 % (ref 36.0–46.0)
Hemoglobin: 12.8 g/dL (ref 12.0–15.0)
Immature Granulocytes: 0 %
Lymphocytes Relative: 25 %
Lymphs Abs: 2.5 10*3/uL (ref 0.7–4.0)
MCH: 27.7 pg (ref 26.0–34.0)
MCHC: 32.8 g/dL (ref 30.0–36.0)
MCV: 84.4 fL (ref 80.0–100.0)
Monocytes Absolute: 0.5 10*3/uL (ref 0.1–1.0)
Monocytes Relative: 5 %
Neutro Abs: 6.8 10*3/uL (ref 1.7–7.7)
Neutrophils Relative %: 69 %
Platelets: 273 10*3/uL (ref 150–400)
RBC: 4.62 MIL/uL (ref 3.87–5.11)
RDW: 14.7 % (ref 11.5–15.5)
WBC: 9.8 10*3/uL (ref 4.0–10.5)
nRBC: 0 % (ref 0.0–0.2)

## 2019-10-19 LAB — COMPREHENSIVE METABOLIC PANEL
ALT: 26 U/L (ref 0–44)
AST: 25 U/L (ref 15–41)
Albumin: 3.3 g/dL — ABNORMAL LOW (ref 3.5–5.0)
Alkaline Phosphatase: 81 U/L (ref 38–126)
Anion gap: 10 (ref 5–15)
BUN: 8 mg/dL (ref 6–20)
CO2: 19 mmol/L — ABNORMAL LOW (ref 22–32)
Calcium: 8.6 mg/dL — ABNORMAL LOW (ref 8.9–10.3)
Chloride: 107 mmol/L (ref 98–111)
Creatinine, Ser: 0.58 mg/dL (ref 0.44–1.00)
GFR calc Af Amer: >60 mL/min (ref >60)
GFR calc non Af Amer: >60 mL/min (ref >60)
Glucose, Bld: 94 mg/dL (ref 70–99)
Potassium: 3.7 mmol/L (ref 3.5–5.1)
Sodium: 136 mmol/L (ref 135–145)
Total Bilirubin: 0.4 mg/dL (ref 0.3–1.2)
Total Protein: 7 g/dL (ref 6.5–8.1)

## 2019-10-19 LAB — ABO/RH: ABO/RH(D): O POS

## 2019-10-19 LAB — LIPASE, BLOOD: Lipase: 25 U/L (ref 11–51)

## 2019-10-19 LAB — I-STAT BETA HCG BLOOD, ED (MC, WL, AP ONLY): I-stat hCG, quantitative: >2000 m[IU]/mL — ABNORMAL HIGH (ref <5)

## 2019-10-19 MED ORDER — HYDROMORPHONE HCL 1 MG/ML IJ SOLN
0.5000 mg | Freq: Once | INTRAMUSCULAR | Status: AC
  Administered 2019-10-19: 0.5 mg via INTRAVENOUS
  Filled 2019-10-19: qty 1

## 2019-10-19 MED ORDER — IBUPROFEN 400 MG PO TABS
600.0000 mg | ORAL_TABLET | Freq: Once | ORAL | Status: DC
  Filled 2019-10-19: qty 1

## 2019-10-19 NOTE — ED Triage Notes (Signed)
Pt endorses scant vaginal bleeding without pain x several days, but severe pelvic and back pain developed last night and bleeding has been heavy since then. LMP three weeks ago. Has IUD.

## 2019-10-19 NOTE — MAU Provider Note (Signed)
History     CSN: [id number]  Arrival date and time: 10/19/19 1111  Chief Complaint  Patient presents with  . Vaginal Bleeding  . Abdominal Pain   HPI Brenda Cline is a 29 y.o. [phone] at [redacted]w[redacted]d who presents from [facility] for evaluation for vaginal bleeding. She states she was told by the ED that she is pregnant but has an IUD. She reports heavy bleeding that started yesterday and became heavier today. She reports passing large clots. She is also having abdominal pain that she rates a 10/10.   OB History    Gravida  4   Para  3   Term  3   Preterm  0   AB  0   Living  3     SAB  0   TAB  0   Ectopic  0   Multiple  0   Live Births  3           Past Medical History:  Diagnosis Date  . No pertinent past medical history     Past Surgical History:  Procedure Laterality Date  . NO PAST SURGERIES      Family History  Problem Relation Age of Onset  . Other Neg Hx   . Hearing loss Neg Hx     Social History   Tobacco Use  . Smoking status: Never Smoker  . Smokeless tobacco: Never Used  Substance Use Topics  . Alcohol use: No  . Drug use: No    Allergies:  Allergies  Allergen Reactions  . Penicillins Rash    Has patient had a PCN reaction causing immediate rash, facial/tongue/throat swelling, SOB or lightheadedness with hypotension: Yes Has patient had a PCN reaction causing severe rash involving mucus membranes or skin necrosis: No Has patient had a PCN reaction that required hospitalization No Has patient had a PCN reaction occurring within the last 10 years: No If all of the above answers are "NO", then may proceed with Cephalosporin use.     Medications Prior to Admission  Medication Sig Dispense Refill Last Dose  . cephALEXin (KEFLEX) 500 MG capsule Take 1 capsule (500 mg total) by mouth 3 (three) times daily. 30 capsule 0   . cyclobenzaprine (FLEXERIL) 10 MG tablet Take 1 tablet (10 mg total) by mouth 2 (two) times daily as needed for  muscle spasms. 10 tablet 0   . HYDROcodone-ibuprofen (VICOPROFEN) 7.5-200 MG tablet Take 1 tablet by mouth every 6 (six) hours as needed for moderate pain. 15 tablet 0   . ibuprofen (ADVIL,MOTRIN) 800 MG tablet Take 1 tablet (800 mg total) by mouth 3 (three) times daily. 21 tablet 0   . ondansetron (ZOFRAN) 4 MG tablet Take 1 tablet (4 mg total) by mouth every 6 (six) hours. 8 tablet 0     Review of Systems  Constitutional: Negative.  Negative for fatigue and fever.  HENT: Negative.   Respiratory: Negative.  Negative for shortness of breath.   Cardiovascular: Negative.  Negative for chest pain.  Gastrointestinal: Positive for abdominal pain. Negative for constipation, diarrhea, nausea and vomiting.  Genitourinary: Positive for vaginal bleeding. Negative for dysuria and vaginal discharge.  Neurological: Negative.  Negative for dizziness and headaches.   Physical Exam   Blood pressure 110/70, pulse 89, temperature 98.1 F (36.7 C), temperature source Oral, resp. rate 20, last menstrual period 09/28/2019, SpO2 98 %, unknown if currently breastfeeding.  Physical Exam  Nursing note and vitals reviewed. Constitutional: She is oriented to person,  place, and time. She appears well-developed. No distress.  HENT:  Head: Normocephalic.  Eyes: Pupils are equal, round, and reactive to light.  Cardiovascular: Normal rate, regular rhythm and normal heart sounds.  Respiratory: Effort normal and breath sounds normal. No respiratory distress.  GI: Soft. Bowel sounds are normal. She exhibits no distension. There is no abdominal tenderness.  Genitourinary:    Genitourinary Comments: SSE: POC sitting in cervix and vaginal vault. Removed easily. No bleeding after removal of POCs   Neurological: She is alert and oriented to person, place, and time.  Skin: Skin is warm and dry.  Psychiatric: Her behavior is normal. Judgment and thought content normal.    MAU Course  Procedures Results for orders placed  or performed during the hospital encounter of 10/19/19 (from the past 24 hour(s))  Comprehensive metabolic panel     Status: Abnormal   Collection Time: 10/19/19  9:53 AM  Result Value Ref Range   Sodium 136 135 - 145 mmol/L   Potassium 3.7 3.5 - 5.1 mmol/L   Chloride 107 98 - 111 mmol/L   CO2 19 (L) 22 - 32 mmol/L   Glucose, Bld 94 70 - 99 mg/dL   BUN 8 6 - 20 mg/dL   Creatinine, Ser 7.82 0.44 - 1.00 mg/dL   Calcium 8.6 (L) 8.9 - 10.3 mg/dL   Total Protein 7.0 6.5 - 8.1 g/dL   Albumin 3.3 (L) 3.5 - 5.0 g/dL   AST 25 15 - 41 U/L   ALT 26 0 - 44 U/L   Alkaline Phosphatase 81 38 - 126 U/L   Total Bilirubin 0.4 0.3 - 1.2 mg/dL   GFR calc non Af Amer >60 >60 mL/min   GFR calc Af Amer >60 >60 mL/min   Anion gap 10 5 - 15  Lipase, blood     Status: None   Collection Time: 10/19/19  9:53 AM  Result Value Ref Range   Lipase 25 11 - 51 U/L  CBC with Differential     Status: None   Collection Time: 10/19/19  9:53 AM  Result Value Ref Range   WBC 9.8 4.0 - 10.5 K/uL   RBC 4.62 3.87 - 5.11 MIL/uL   Hemoglobin 12.8 12.0 - 15.0 g/dL   HCT 95.6 36 - 46 %   MCV 84.4 80.0 - 100.0 fL   MCH 27.7 26.0 - 34.0 pg   MCHC 32.8 30.0 - 36.0 g/dL   RDW 21.3 08.6 - 57.8 %   Platelets 273 150 - 400 K/uL   nRBC 0.0 0.0 - 0.2 %   Neutrophils Relative % 69 %   Neutro Abs 6.8 1.7 - 7.7 K/uL   Lymphocytes Relative 25 %   Lymphs Abs 2.5 0.7 - 4.0 K/uL   Monocytes Relative 5 %   Monocytes Absolute 0.5 0 - 1 K/uL   Eosinophils Relative 1 %   Eosinophils Absolute 0.1 0 - 0 K/uL   Basophils Relative 0 %   Basophils Absolute 0.0 0 - 0 K/uL   Immature Granulocytes 0 %   Abs Immature Granulocytes 0.03 0.00 - 0.07 K/uL  I-Stat Beta hCG blood, ED ([facility], [facility], [facility] only)     Status: Abnormal   Collection Time: 10/19/19 10:08 AM  Result Value Ref Range   I-stat hCG, quantitative >2,000.0 (H) <5 mIU/mL   Comment 3          Urinalysis, Routine w reflex microscopic     Status: Abnormal   Collection Time: 10/19/19  10:45 AM  Result Value Ref Range   Color, Urine RED (A) YELLOW   APPearance HAZY (A) CLEAR   Specific Gravity, Urine 1.010 1.005 - 1.030   pH 8.0 5.0 - 8.0   Glucose, UA NEGATIVE NEGATIVE mg/dL   Hgb urine dipstick LARGE (A) NEGATIVE   Bilirubin Urine NEGATIVE NEGATIVE   Ketones, ur NEGATIVE NEGATIVE mg/dL   Protein, ur 30 (A) NEGATIVE mg/dL   Nitrite NEGATIVE NEGATIVE   Leukocytes,Ua NEGATIVE NEGATIVE   RBC / HPF >50 (H) 0 - 5 RBC/hpf   Bacteria, UA NONE SEEN NONE SEEN   Squamous Epithelial / LPF 0-5 0 - 5  Type and screen [facility]     Status: None   Collection Time: 10/19/19 10:45 AM  Result Value Ref Range   ABO/RH(D) O POS    Antibody Screen NEG    Sample Expiration      10/22/2019,2359 Performed at [facility], [address] [place], [place] [address]   ABO/Rh     Status: None (Preliminary result)   Collection Time: 10/19/19 10:45 AM  Result Value Ref Range   ABO/RH(D)      O POS Performed at [facility], [address], [place], [place] [address]   hCG, quantitative, pregnancy     Status: Abnormal   Collection Time: 10/19/19 10:46 AM  Result Value Ref Range   hCG, Beta Chain, Quant, S 26,046 (H) <5 mIU/mL   US OB LESS THAN 14 WEEKS WITH OB TRANSVAGINAL  Result Date: 10/19/2019 CLINICAL DATA:  Vaginal bleeding with positive beta HCG EXAM: OBSTETRIC <14 WK [place] AND TRANSVAGINAL OB US TECHNIQUE: Both transabdominal and transvaginal ultrasound examinations were performed for complete evaluation of the gestation as well as the maternal uterus, adnexal regions, and pelvic cul-de-sac. Transvaginal technique was performed to assess early pregnancy. COMPARISON:  None. FINDINGS: Intrauterine gestational sac: Not visualized Yolk sac:  Not visualized Embryo:  Not visualized Cardiac Activity: Not visualized Subchorionic hemorrhage:  None visualized. Maternal uterus/adnexae: No intrauterine device is evident within the uterus. The cervical os is  closed. Left ovary measures 2.7 x 1.3 x 2.3 cm. There is a corpus luteum on the left measuring 1.5 x 1.5 x 1.5 cm. Right ovary measures 3.1 x 2.8 x 2.5 cm. There is trace free pelvic fluid. IMPRESSION: No intrauterine gestation appreciable currently. Given positive beta HCG value, differential considerations must include intrauterine gestation too early to be seen by either transabdominal transvaginal technique; recent spontaneous abortion; possible ectopic gestation. This circumstance warrants close clinical and laboratory surveillance. Timing of repeat ultrasound in large part will depend on beta HCG values going forward. No intrauterine device evident. Apparent corpus luteum left ovary. No other extrauterine pelvic mass. Trace free pelvic fluid may be physiologic. Electronically Signed   By: Bretta Bang III M.D.   On: 10/19/2019 13:08    MDM Labs reviewed from ED US OB Transvaginal Dilaudid 0.5mg  IV  Consulted with Dr. Shawnie Pons- likely miscarriage but due to high HCG and nothing seen on ultrasound, will do 48hr repeat HCG at [facility]  Lengthy conversation with patient about exams and results. Patient verbalized understanding and need for close follow up. Patient requesting ANORA testing. Reviewed strict ectopic precautions.  Assessment and Plan   1. Vaginal bleeding in pregnancy   2. IUD failure, pregnant   3. Vaginal bleeding in pregnancy, first trimester   4. Miscarriage    -Discharge home in stable condition -Strict ectopic precautions discussed -Patient  advised to follow-up with [facility] on Monday at 0900 for repeat HCG -Patient may return to [facility]U as needed or if her condition were to change or worsen   Rolm Bookbinder CNM 10/19/2019, 1:55 PM

## 2019-10-19 NOTE — ED Provider Notes (Signed)
[facility] EMERGENCY DEPARTMENT Provider Note   CSN: [id number] Arrival date & time: 10/19/19  8546     History Chief Complaint  Patient presents with  . Vaginal Bleeding  . Abdominal Pain    Brenda Cline is a 29 y.o. female who presents to the ED today with complaint of sudden onset, constant, severe, sharp, 10/10, diffuse lower back pain and lower abdominal pain that began yesterday.  Patient ports her last normal menstrual period was about 3 weeks ago.  She states that it was a little heavier than normal however she did not think much of it.  She states that after returning from a trip to [place] proximately 3 to 4 days after finishing her period she began having some light spotting.  She did not think much of it until she started having severe pain yesterday.  She did not take anything for pain and tried to sleep it off.  She states she woke up this morning still in pain however felt like she was "wet"down below in her underwear region.  She states she went to the restroom and noticed heavy vaginal bleeding as well as clots.  Patient states that she had moderate size pads and she had to change it out approximately 4 times within an hour.  She then borrowed a large maxi pad from a family member and went to work however states that about 2 hours later it was fully saturated.  She states she continued to pass clots the blood has been running down her leg concerning her.  Patient made her family members drive her to the ED for further evaluation.  She does have an IUD in place that was placed approximately 5 to 6 years ago after giving birth to her child.  She does not have an OB/GYN.  Patient is married and sexually active with her husband, they do not use protection.  She is not concerned about STIs at this time.  She denies any urinary symptoms today.  No fevers or chills.   The history is provided by the patient and medical records.       Past Medical History:    Diagnosis Date  . No pertinent past medical history     Patient Active Problem List   Diagnosis Date Noted  . Active labor 10/21/2014    Past Surgical History:  Procedure Laterality Date  . NO PAST SURGERIES       OB History    Gravida  3   Para  3   Term  3   Preterm  0   AB  0   Living  3     SAB  0   TAB  0   Ectopic  0   Multiple  0   Live Births  3           Family History  Problem Relation Age of Onset  . Other Neg Hx   . Hearing loss Neg Hx     Social History   Tobacco Use  . Smoking status: Never Smoker  . Smokeless tobacco: Never Used  Substance Use Topics  . Alcohol use: No  . Drug use: No    Home Medications Prior to Admission medications   Medication Sig Start Date End Date Taking? Authorizing Provider  cephALEXin (KEFLEX) 500 MG capsule Take 1 capsule (500 mg total) by mouth 3 (three) times daily. 02/14/16   Bethel Born, PA-C  cyclobenzaprine (FLEXERIL) 10 MG tablet Take  1 tablet (10 mg total) by mouth 2 (two) times daily as needed for muscle spasms. 11/14/16   Mackuen, Courteney Lyn, MD  HYDROcodone-ibuprofen (VICOPROFEN) 7.5-200 MG tablet Take 1 tablet by mouth every 6 (six) hours as needed for moderate pain. 02/14/16   Bethel Born, PA-C  ibuprofen (ADVIL,MOTRIN) 800 MG tablet Take 1 tablet (800 mg total) by mouth 3 (three) times daily. 11/14/16   Mackuen, Courteney Lyn, MD  ondansetron (ZOFRAN) 4 MG tablet Take 1 tablet (4 mg total) by mouth every 6 (six) hours. 02/14/16   Bethel Born, PA-C    Allergies    Penicillins  Review of Systems   Review of Systems  Constitutional: Negative for chills and fever.  Gastrointestinal: Positive for abdominal pain. Negative for nausea and vomiting.  Genitourinary: Positive for pelvic pain. Negative for difficulty urinating, dysuria and flank pain.  Musculoskeletal: Positive for back pain.  All other systems reviewed and are negative.   Physical Exam Updated Vital  Signs BP 115/90 (BP Location: Left Arm)   Pulse (!) 101   Temp 98.2 F (36.8 C) (Oral)   Resp 20   LMP 09/28/2019 (Approximate)   SpO2 97%   Physical Exam Vitals and nursing note reviewed.  Constitutional:      Comments: Uncomfortable appearing female  HENT:     Head: Normocephalic and atraumatic.  Eyes:     Conjunctiva/sclera: Conjunctivae normal.  Cardiovascular:     Rate and Rhythm: Normal rate and regular rhythm.     Heart sounds: Normal heart sounds.  Pulmonary:     Effort: Pulmonary effort is normal.     Breath sounds: Normal breath sounds. No wheezing, rhonchi or rales.  Abdominal:     Palpations: Abdomen is soft.     Tenderness: There is abdominal tenderness in the right lower quadrant, suprapubic area and left lower quadrant. There is no right CVA tenderness, left CVA tenderness, guarding or rebound.  Genitourinary:    Comments: Chaperone present for exam. No rashes, lesions, or tenderness to external genitalia. No erythema, injury, or tenderness to vaginal mucosa. Large amount of blood and blot clots in vault. Unable to visualize Os. Diffuse TTP with bimanual exam.  Musculoskeletal:     Cervical back: Neck supple.     Comments: Diffuse bilateral paralumbar musculature TTP  Skin:    General: Skin is warm and dry.  Neurological:     Mental Status: She is alert.     ED Results / Procedures / Treatments   Labs (all labs ordered are listed, but only abnormal results are displayed) Labs Reviewed  COMPREHENSIVE METABOLIC PANEL - Abnormal; Notable for the following components:      Result Value   CO2 19 (*)    Calcium 8.6 (*)    Albumin 3.3 (*)    All other components within normal limits  I-STAT BETA HCG BLOOD, ED ([facility], [facility], [facility] ONLY) - Abnormal; Notable for the following components:   I-stat hCG, quantitative >2,000.0 (*)    All other components within normal limits  LIPASE, BLOOD  CBC WITH DIFFERENTIAL/PLATELET  URINALYSIS, ROUTINE W REFLEX MICROSCOPIC  HCG,  QUANTITATIVE, PREGNANCY  TYPE AND SCREEN  ABO/RH    EKG None  Radiology No results found.  Procedures Procedures (including critical care time)  Medications Ordered in ED Medications - No data to display  ED Course  I have reviewed the triage vital signs and the nursing notes.  Pertinent labs & imaging results that were available during my care  of the patient were reviewed by me and considered in my medical decision making (see chart for details).  Clinical Course as of Oct 18 1040  Sat Oct 19, 2019  1033 I-stat hCG, quantitative(!): >2,000.0 [MV]    Clinical Course User Index [MV] Eustaquio Maize, [place]   MDM Rules/Calculators/A&P                          29 year old female who presents to the ED today with complaint of severe pelvic and back pain that began last night with heavy vaginal bleeding that began this morning.  She does however state that she has been spotting intermittently for the past 3 weeks, last normal menstrual period 3 weeks ago.  Patient does have IUD in place, she is unsure which IUD however she believes it is the [place] and states it was placed 5 years ago.  She does not currently have an OB/GYN.  On arrival to the ED patient is afebrile nontachypneic.  She is a very mildly tachycardic in the very low 100s.  She does appear uncomfortable today.  She has diffuse paralumbar skin which are tenderness palpation as well as diffuse lower abdominal tenderness palpation however no peritoneal signs.  Will perform pelvic exam at this time.  Will obtain labs including CBC to check hemoglobin, i-STAT beta-hCG hCG despite patient having IUD in place.   Pelvic exam performed prior to hCG resulting, large amount of blood clots in vault.  Unable to fully visualize cervix due to amount of blood.  Not hemorrhaging.   hCG has returned positive greater than 2000.  There is concern for ectopic pregnancy at this time given IUD.  Have ordered type and screen, ABO, quant.  Had  initially ordered ultrasound here however will touch base with [facility] given positive pregnancy test. Had initially ordered Ibuprofen for pain; discontinued.   Discussed case with NP at [facility]U who recommends patient be transferred over for further evaluation, they will take over their care.  Attending physician Dr. Kathrynn Humble made aware.  Patient is stable for transfer.  She has been updated on plan.   This note was prepared using Dragon voice recognition software and may include unintentional dictation errors due to the inherent limitations of voice recognition software.  Final Clinical Impression(s) / ED Diagnoses Final diagnoses:  Vaginal bleeding in pregnancy  IUD failure, pregnant    Rx / DC Orders ED Discharge Orders    None       Eustaquio Maize, PA-C 10/19/19 [place], Ankit, MD 10/20/19 1436

## 2019-10-19 NOTE — MAU Note (Signed)
Patient presents from main ED for vaginal bleeding and abdominal pain with IUD.  Transferred to [facility]U for [place] and further care after confirmed pregnancy.

## 2019-10-19 NOTE — Discharge Instructions (Signed)
Aborto espontneo Miscarriage El aborto espontneo es la prdida de un beb que no ha nacido (feto) antes de la semana20 del embarazo. La mayor parte de los abortos espontneos ocurre durante los primeros 3meses de embarazo. A veces, un aborto ocurre antes de que la mujer sepa que est embarazada. El aborto espontneo puede ser una experiencia que afecte emocionalmente a la persona. Si ha sufrido un aborto espontneo, hable con su mdico y hgale las preguntas que tenga sobre el aborto espontneo, el proceso de duelo y los planes futuros de embarazo. Cules son las causas? Entre las causas de un aborto espontneo se incluyen las siguientes:  Problemas genticos o cromosmicos del feto. Estos problemas impiden que el beb se desarrolle con normalidad. En general, son el resultado de errores fortuitos que ocurren en la etapa temprana del desarrollo y que no se transmiten de padres a hijos (no se heredan).  Infeccin en el cuello del tero.  Trastornos que afectan el equilibrio hormonal del organismo.  Problemas en el cuello del tero, como su adelgazamiento y apertura antes de que el embarazo llegue a trmino (insuficiencia del cuello de tero).  Problemas en el tero. Estos pueden incluir, entre otros, los siguientes: ? Forma anormal del tero. ? Fibromas en el tero. ? Anormalidades congnitas. Estos son problemas que ya estaban presentes en el nacimiento.  Ciertas enfermedades crnicas.  Fumar, beber alcohol o usar drogas.  Lesiones (traumatismos). En muchos de los casos, se desconoce la causa de los abortos espontneos. Cules son los signos o los sntomas? Los sntomas de esta afeccin incluyen los siguientes:  Sangrado o manchado vaginal, con o sin clicos o dolor.  Dolor o clicos en el abdomen o en la parte inferior de la espalda.  Eliminacin de lquido, tejidos o cogulos sanguneos por la vagina. Cmo se diagnostica? Esta afeccin se puede diagnosticar en funcin de  lo siguiente:  Examen fsico.  Ecografa.  Anlisis de sangre.  Anlisis de orina. Cmo se trata? En algunos casos, el tratamiento de un aborto espontneo no es necesario si se eliminan de forma natural todos los tejidos que se encontraban en el tero. Si fuera necesario realizar un tratamiento por esta afeccin, este puede incluir lo siguiente:  Dilatacin y curetaje (D&C). Mediante este procedimiento, se expande el cuello del tero y se raspan las paredes (endometrio). Esto se realiza solamente si queda tejido del feto o la placenta dentro del cuerpo (aborto espontneo incompleto).  Medicamentos, por ejemplo: ? Antibiticos para tratar una infeccin. ? Medicamentos para ayudar al cuerpo a eliminar los restos de tejido. ? Medicamentos para reducir (contraer) el tamao del tero. Estos medicamentos se pueden administrar si tiene un sangrado abundante. Si su factor sanguneo es Rhnegativo y el de su beb es Rhpositivo, usted necesitar una inyeccin del medicamento llamado inmunoglobulinaRhpara proteger a los bebs futuros de tener problemas con el factorsanguneoRh. Los trminos "Rhnegativo" y "Rhpositivo" hacen referencia a la presencia o no en la sangre de una protena especfica que se encuentra en la superficie de los glbulos rojos (factor Rh). Siga estas indicaciones en su casa: Medicamentos   Tome los medicamentos de venta libre y los recetados solamente como se lo haya indicado el mdico.  Si le recetaron antibiticos, tmelos como se lo haya indicado el mdico. No deje de tomar los antibiticos aunque comience a sentirse mejor.  No tome antiinflamatorios no esteroideos (AINE), tales como aspirina e ibuprofeno, a menos que se lo indique el mdico. Estos medicamentos pueden provocarle sangrado. Actividad  Haga   reposo segn lo indicado. Pregntele al mdico qu actividades son seguras para usted.  Pdale a alguien que la ayude con las responsabilidades familiares y del  hogar durante este tiempo. Instrucciones generales  Lleve un registro de la cantidad y la saturacin de las toallas higinicas que utiliza cada da. Anote esta informacin.  Anote la cantidad de tejido o cogulos sanguneos que expulsa por la vagina. Guarde las cantidades grandes de tejidos para que el mdico los examine.  No use tampones, no se haga duchas vaginales ni tenga relaciones sexuales hasta que el mdico la autorice.  Para que usted y su pareja puedan sobrellevar el proceso del duelo, hable con su mdico o busque apoyo psicolgico.  Cuando est lista, visite a su mdico para hablar sobre los pasos importantes que deber seguir en relacin con su salud. Tambin hable sobre las medidas que deber tomar para tener un embarazo saludable en el futuro.  Concurra a todas las visitas de seguimiento como se lo haya indicado el mdico. Esto es importante. Dnde encontrar ms informacin  Colegio Estadounidense de Obstetras y Gineclogos (American College of Obstetricians and Gynecologists): www.acog.org  Departamento de Salud y Servicios Humanos de los [place], Oficina de Salud de la Mujer (U.S. Department of Health and Human Services, Office on Women's Health): www.womenshealth.gov Comunquese con un mdico si:  Tiene fiebre o siente escalofros.  Tiene una secrecin vaginal con mal olor.  El sangrado aumenta en vez de disminuir. Solicite ayuda de inmediato si:  Siente calambres intensos o dolor en la espalda o en el abdomen.  Elimina cogulos de sangre o tejido por la vagina del tamao de una nuez o ms grandes.  Necesita ms de una toalla higinica de tamao regular por hora.  Se siente mareada o dbil.  Se desmaya.  Siente una tristeza que la invade o piensa en lastimarse. Resumen  La mayor parte de los abortos espontneos ocurre durante los primeros 3meses de embarazo. En algunos casos, el aborto espontneo ocurre antes de que la mujer sepa que est  embarazada.  Siga las indicaciones del mdico para el cuidado en el hogar. Concurra a todas las visitas de control.  Para que usted y su pareja puedan sobrellevar el proceso del duelo, hable con su mdico o busque apoyo psicolgico. Esta informacin no tiene como fin reemplazar el consejo del mdico. Asegrese de hacerle al mdico cualquier pregunta que tenga. Document Revised: 01/30/2017 Document Reviewed: 01/30/2017 Elsevier Patient Education  2020 Elsevier Inc.  

## 2019-10-19 NOTE — ED Notes (Signed)
Pt will go to MAU after [place], Katie RN charge made aware

## 2019-10-19 NOTE — MAU Note (Signed)
Printed AVS given and discharge instructions discussed.  Pt verbalized understanding.  Pt signed printed AVS copy as the Esignature link was not available in her chart.

## 2019-10-19 NOTE — ED Notes (Signed)
Patient transported to Ultrasound 

## 2019-10-21 ENCOUNTER — Inpatient Hospital Stay (HOSPITAL_COMMUNITY)
Admission: AD | Admit: 2019-10-21 | Discharge: 2019-10-21 | Disposition: A | Discharge status: Home / Self Care | Payer: Self-pay | Attending: Obstetrics & Gynecology | Admitting: Obstetrics & Gynecology

## 2019-10-21 ENCOUNTER — Other Ambulatory Visit: Payer: Self-pay

## 2019-10-21 ENCOUNTER — Ambulatory Visit (INDEPENDENT_AMBULATORY_CARE_PROVIDER_SITE_OTHER): Payer: Self-pay | Admitting: *Deleted

## 2019-10-21 ENCOUNTER — Encounter (HOSPITAL_COMMUNITY): Payer: Self-pay | Admitting: Obstetrics & Gynecology

## 2019-10-21 ENCOUNTER — Inpatient Hospital Stay (HOSPITAL_COMMUNITY): Payer: Self-pay

## 2019-10-21 VITALS — BP 132/84 | HR 101 | Temp 98.3°F | Ht 61.0 in | Wt 199.5 lb

## 2019-10-21 DIAGNOSIS — O4691 Antepartum hemorrhage, unspecified, first trimester: Secondary | ICD-10-CM

## 2019-10-21 DIAGNOSIS — O4690 Antepartum hemorrhage, unspecified, unspecified trimester: Secondary | ICD-10-CM

## 2019-10-21 DIAGNOSIS — O039 Complete or unspecified spontaneous abortion without complication: Secondary | ICD-10-CM | POA: Insufficient documentation

## 2019-10-21 DIAGNOSIS — Z880 Allergy status to penicillin: Secondary | ICD-10-CM | POA: Insufficient documentation

## 2019-10-21 DIAGNOSIS — R9389 Abnormal findings on diagnostic imaging of other specified body structures: Secondary | ICD-10-CM | POA: Insufficient documentation

## 2019-10-21 DIAGNOSIS — R109 Unspecified abdominal pain: Secondary | ICD-10-CM

## 2019-10-21 DIAGNOSIS — Z791 Long term (current) use of non-steroidal anti-inflammatories (NSAID): Secondary | ICD-10-CM | POA: Insufficient documentation

## 2019-10-21 DIAGNOSIS — Z79899 Other long term (current) drug therapy: Secondary | ICD-10-CM | POA: Insufficient documentation

## 2019-10-21 LAB — COMPREHENSIVE METABOLIC PANEL
ALT: 26 U/L (ref 0–44)
AST: 21 U/L (ref 15–41)
Albumin: 3.3 g/dL — ABNORMAL LOW (ref 3.5–5.0)
Alkaline Phosphatase: 96 U/L (ref 38–126)
Anion gap: 11 (ref 5–15)
BUN: 8 mg/dL (ref 6–20)
CO2: 21 mmol/L — ABNORMAL LOW (ref 22–32)
Calcium: 9 mg/dL (ref 8.9–10.3)
Chloride: 106 mmol/L (ref 98–111)
Creatinine, Ser: 0.46 mg/dL (ref 0.44–1.00)
GFR calc Af Amer: >60 mL/min (ref >60)
GFR calc non Af Amer: >60 mL/min (ref >60)
Glucose, Bld: 103 mg/dL — ABNORMAL HIGH (ref 70–99)
Potassium: 3.7 mmol/L (ref 3.5–5.1)
Sodium: 138 mmol/L (ref 135–145)
Total Bilirubin: 0.3 mg/dL (ref 0.3–1.2)
Total Protein: 7 g/dL (ref 6.5–8.1)

## 2019-10-21 LAB — HEMOGLOBIN AND HEMATOCRIT, BLOOD
HCT: 37.4 % (ref 36.0–46.0)
Hemoglobin: 12.1 g/dL (ref 12.0–15.0)

## 2019-10-21 LAB — HCG, QUANTITATIVE, PREGNANCY: hCG, Beta Chain, Quant, S: 2576 m[IU]/mL — ABNORMAL HIGH (ref <5)

## 2019-10-21 MED ORDER — OXYCODONE HCL 5 MG PO TABS: 5.0000 mg | ORAL_TABLET | Freq: Three times a day (TID) | ORAL | 0 refills | Status: AC | PRN

## 2019-10-21 MED ORDER — PROMETHAZINE HCL 25 MG/ML IJ SOLN
12.5000 mg | Freq: Once | INTRAMUSCULAR | Status: AC
  Administered 2019-10-21: 12.5 mg via INTRAMUSCULAR
  Filled 2019-10-21: qty 1

## 2019-10-21 MED ORDER — HYDROMORPHONE HCL 1 MG/ML IJ SOLN
1.0000 mg | Freq: Once | INTRAMUSCULAR | Status: AC
  Administered 2019-10-21: 1 mg via INTRAMUSCULAR
  Filled 2019-10-21: qty 1

## 2019-10-21 MED ORDER — IBUPROFEN 800 MG PO TABS
800.0000 mg | ORAL_TABLET | Freq: Once | ORAL | Status: AC
  Administered 2019-10-21: 800 mg via ORAL
  Filled 2019-10-21: qty 1

## 2019-10-21 MED ORDER — MISOPROSTOL 200 MCG PO TABS
1000.0000 ug | ORAL_TABLET | Freq: Once | ORAL | Status: AC
  Administered 2019-10-21: 1000 ug via BUCCAL
  Filled 2019-10-21: qty 5

## 2019-10-21 NOTE — Progress Notes (Signed)
Agree with A & P. 

## 2019-10-21 NOTE — MAU Provider Note (Signed)
Chief Complaint: Abdominal Pain, Back Pain, and Vaginal Bleeding   First Provider Initiated Contact with Patient 10/21/19 1052     SUBJECTIVE HPI: Brenda Cline is a 29 y.o. G4P3003 at [redacted]w[redacted]d who presents to [facility] reporting abdominal pain & vaginal bleeding.  Was seen in [facility]U for miscarriage. Probable POCs pulled from cervix on exam during that visit. HCG 26k and ultrasound showed no IUP with CLC & trace free fluid. Likely miscarriage but per consult with MD, patient sent to office today for repeat HCG.  Patient sent here for further evaluation due to continued abdominal pain.  Reports pain in lower abdomen & back that she rates 10/10. Pain no worse than Saturday but has not improved at all. Not improved with ibuprofen. Also reports heavy bleeding; filling up 1 pad per hour and passing some clots.   Location: abdomen Quality: cramping Severity: 10/10 on pain scale Duration: 3 days Timing: constant Modifying factors: none Associated signs and symptoms: vaginal bleeding  Past Medical History:  Diagnosis Date  . No pertinent past medical history    OB History  Gravida Para Term Preterm AB Living  4 3 3  0 0 3  SAB TAB Ectopic Multiple Live Births  0 0 0 0 3    # Outcome Date GA Lbr Len/2nd Weight Sex Delivery Anes PTL Lv  4 Current           3 Term 10/21/14 [redacted]w[redacted]d 10:21 / 00:03 3415 g M Vag-Spont None  LIV     Birth Comments: WNL  2 Term 06/13/12 [redacted]w[redacted]d 08:32 / 00:05 3742 g M Vag-Spont None  LIV     Birth Comments: No anomalies noted. Mongolian spots noted.  1 Term 03/11/11 [redacted]w[redacted]d 06:40 / 00:12 3586 g F Vag-Spont Local  LIV   Past Surgical History:  Procedure Laterality Date  . NO PAST SURGERIES     Social History   Socioeconomic History  . Marital status: Single    Spouse name: Not on file  . Number of children: Not on file  . Years of education: Not on file  . Highest education level: Not on file  Occupational History  . Not on file  Tobacco Use  .  Smoking status: Never Smoker  . Smokeless tobacco: Never Used  Vaping Use  . Vaping Use: Never used  Substance and Sexual Activity  . Alcohol use: No  . Drug use: No  . Sexual activity: Yes    Birth control/protection: None  Other Topics Concern  . Not on file  Social History Narrative  . Not on file   Social Determinants of Health   Financial Resource Strain:   . Difficulty of Paying Living Expenses:   Food Insecurity:   . Worried About [redacted]w[redacted]d in the Last Year:   . Programme researcher, broadcasting/film/video in the Last Year:   Transportation Needs:   . Barista (Medical):   Freight forwarder Lack of Transportation (Non-Medical):   Physical Activity:   . Days of Exercise per Week:   . Minutes of Exercise per Session:   Stress:   . Feeling of Stress :   Social Connections:   . Frequency of Communication with Friends and Family:   . Frequency of Social Gatherings with Friends and Family:   . Attends Religious Services:   . Active Member of Clubs or Organizations:   . Attends [employer] Meetings:   Banker Marital Status:   Intimate Partner Violence:   .  Fear of Current or Ex-Partner:   . Emotionally Abused:   [employer] Physically Abused:   . Sexually Abused:    Family History  Problem Relation Age of Onset  . Other Neg Hx   . Hearing loss Neg Hx    No current facility-administered medications on file prior to encounter.   Current Outpatient Medications on File Prior to Encounter  Medication Sig Dispense Refill  . cyclobenzaprine (FLEXERIL) 10 MG tablet Take 1 tablet (10 mg total) by mouth 2 (two) times daily as needed for muscle spasms. 10 tablet 0  . ibuprofen (ADVIL,MOTRIN) 800 MG tablet Take 1 tablet (800 mg total) by mouth 3 (three) times daily. 21 tablet 0  . ondansetron (ZOFRAN) 4 MG tablet Take 1 tablet (4 mg total) by mouth every 6 (six) hours. 8 tablet 0   Allergies  Allergen Reactions  . Penicillins Rash    Has patient had a PCN reaction causing immediate rash,  facial/tongue/throat swelling, SOB or lightheadedness with hypotension: Yes Has patient had a PCN reaction causing severe rash involving mucus membranes or skin necrosis: No Has patient had a PCN reaction that required hospitalization No Has patient had a PCN reaction occurring within the last 10 years: No If all of the above answers are "NO", then may proceed with Cephalosporin use.     I have reviewed patient's Past Medical Hx, Surgical Hx, Family Hx, Social Hx, medications and allergies.   Review of Systems  Constitutional: Negative.   Gastrointestinal: Positive for abdominal pain.  Genitourinary: Positive for vaginal bleeding.    OBJECTIVE Patient Vitals for the past 24 hrs:  BP Temp Temp src Pulse Resp  10/21/19 1014 122/76 98.3 F (36.8 C) Oral (!) 103 20   Constitutional: Well-developed, well-nourished female in no acute distress.  Cardiovascular: normal rate & rhythm, no murmur Respiratory: normal rate and effort. Lung sounds clear throughout GI: Abd soft, non-tender, Pos BS x 4. No guarding or rebound tenderness MS: Extremities nontender, no edema, normal ROM Neurologic: Alert and oriented x 4.  GU:  NEFG. Small amount of dark red blood.   LAB RESULTS Results for orders placed or performed during the hospital encounter of 10/21/19 (from the past 24 hour(s))  Hemoglobin and hematocrit, blood     Status: None   Collection Time: 10/21/19 11:00 AM  Result Value Ref Range   Hemoglobin 12.1 12.0 - 15.0 g/dL   HCT 37.4 36 - 46 %  Comprehensive metabolic panel     Status: Abnormal   Collection Time: 10/21/19 11:00 AM  Result Value Ref Range   Sodium 138 135 - 145 mmol/L   Potassium 3.7 3.5 - 5.1 mmol/L   Chloride 106 98 - 111 mmol/L   CO2 21 (L) 22 - 32 mmol/L   Glucose, Bld 103 (H) 70 - 99 mg/dL   BUN 8 6 - 20 mg/dL   Creatinine, Ser 0.46 0.44 - 1.00 mg/dL   Calcium 9.0 8.9 - 10.3 mg/dL   Total Protein 7.0 6.5 - 8.1 g/dL   Albumin 3.3 (L) 3.5 - 5.0 g/dL   AST 21 15  - 41 U/L   ALT 26 0 - 44 U/L   Alkaline Phosphatase 96 38 - 126 U/L   Total Bilirubin 0.3 0.3 - 1.2 mg/dL   GFR calc non Af Amer >60 >60 mL/min   GFR calc Af Amer >60 >60 mL/min   Anion gap 11 5 - 15  hCG, quantitative, pregnancy     Status: Abnormal  Collection Time: 10/21/19 11:00 AM  Result Value Ref Range   hCG, Beta Chain, Quant, S 2,576 (H) <5 mIU/mL    IMAGING US OB Transvaginal  Result Date: 10/21/2019 CLINICAL DATA:  Persistent back and pelvic pain with vaginal bleeding. LMP 09/28/2019. Presumptive recent spontaneous abortion. EXAM: TRANSVAGINAL OB ULTRASOUND TECHNIQUE: Transvaginal ultrasound was performed for complete evaluation of the gestation as well as the maternal uterus, adnexal regions, and pelvic cul-de-sac. COMPARISON:  Obstetric ultrasound 10/19/2019 FINDINGS: Intrauterine gestational sac: None. Yolk sac:  None. Embryo:  None. Cardiac Activity: None Subchorionic hemorrhage: None. There is irregular thickening of the endometrium with increased blood flow in the subendometrial uterus. Maternal uterus/adnexae: Both ovaries are visualized. There is a small corpus luteal cyst on the left. No suspicious adnexal findings. No significant free pelvic fluid. IMPRESSION: No intrauterine gestational sac, yolk sac, fetal pole, or cardiac activity visualized. Differential considerations include intrauterine gestation too early to be sonographically visualized, spontaneous abortion, or ectopic pregnancy. Spontaneous abortion favored. Follow-up beta HCG level drawn today is still pending. Electronically Signed   By: Carey Bullocks M.D.   On: 10/21/2019 12:32    MAU COURSE Orders Placed This Encounter  Procedures  . US OB Transvaginal  . Hemoglobin and hematocrit, blood  . Comprehensive metabolic panel  . hCG, quantitative, pregnancy  . Diet NPO time specified  . Discharge patient   Meds ordered this encounter  Medications  . HYDROmorphone (DILAUDID) injection 1 mg  .  promethazine (PHENERGAN) injection 12.5 mg  . misoprostol (CYTOTEC) tablet 1,000 mcg  . ibuprofen (ADVIL) tablet 800 mg  . oxyCODONE (ROXICODONE) 5 MG immediate release tablet    Sig: Take 1 tablet (5 mg total) by mouth every 8 (eight) hours as needed for up to 3 days.    Dispense:  9 tablet    Refill:  0    Order Specific Question:   Supervising Provider    Answer:   Adam Phenix [[id number]]    MDM RH positive  Per review of chart, POCs were collected during pelvic exam on Saturday. Will check HGB, HCG, and ultrasound today.  HCG dropped from 26,046 down to 2,576. Hemoglobin stable at 12.1  Ultrasound shows endometrial thickening with increased blood flow, likely representing some RPOCs. No suspicious adnexal masses.   Consulted with Dr. Debroah Loop. Agrees with miscarriage diagnosis. Will give cytotec & discharge home.   Cytotec 1000 mcg given in MAU. Bleeding stable. Vital signs stable. Brenda Cline reports she is comfortable with being discharged at this time. Will rx roxicodone to use for breakthrough pain, should take tylenol prn per bottle instructions. Hold off on ibuprofen due to increased dosing earlier today.   ASSESSMENT 1. Miscarriage   2. Abdominal pain during pregnancy in first trimester     PLAN Discharge home in stable condition. Rx roxicodone Tylenol prn Msg to [facility] for f/u appt Reviewed reasons to return to MAU    Follow-up Information    [facility] Follow up.   Specialty: Obstetrics and Gynecology Why: office will call you to schedule follow up appointment Contact information: [address] [place] [address] [phone]             Allergies as of 10/21/2019      Reactions   Penicillins Rash   Has patient had a PCN reaction causing immediate rash, facial/tongue/throat swelling, SOB or lightheadedness with hypotension: Yes Has patient had a PCN reaction causing severe rash involving mucus  membranes or skin necrosis: No Has patient had a PCN reaction that required hospitalization No Has patient had a PCN reaction occurring within the last 10 years: No If all of the above answers are "NO", then may proceed with Cephalosporin use.      Medication List    STOP taking these medications   cyclobenzaprine 10 MG tablet Commonly known as: FLEXERIL   ibuprofen 800 MG tablet Commonly known as: ADVIL   ondansetron 4 MG tablet Commonly known as: ZOFRAN     TAKE these medications   oxyCODONE 5 MG immediate release tablet Commonly known as: Roxicodone Take 1 tablet (5 mg total) by mouth every 8 (eight) hours as needed for up to 3 days.        Judeth Horn, NP 10/21/2019  2:34 PM

## 2019-10-21 NOTE — Progress Notes (Signed)
Pt reports continued abdominal and back pain 10/10. She has taken ibuprofen 1600 mg with no relief of pain. Pt was advised this is an extremely unsafe dose of ibuprofen which can cause kidney damage. She should never take more than 800 mg every 8hrs. She also has bright red bleeding with small clots - changing her pad every hour. Per consult with Dr. Alysia Penna, pt was instructed to go to MAU for further evaluation. Stat BHCG was not drawn in office today. Pt voiced understanding of all information and instructions given.

## 2019-10-21 NOTE — Discharge Instructions (Signed)
Return to care  °· If you have heavier bleeding that soaks through more that 2 pads per hour for an hour or more °· If you bleed so much that you feel like you might pass out or you do pass out °· If you have significant abdominal pain that is not improved with Tylenol  °· If you develop a fever > 100.5 ° ° °Miscarriage °A miscarriage is the loss of an unborn baby (fetus) before the 20th week of pregnancy. Most miscarriages happen during the first 3 months of pregnancy. Sometimes, a miscarriage can happen before a woman knows that she is pregnant. °Having a miscarriage can be an emotional experience. If you have had a miscarriage, talk with your health care provider about any questions you may have about miscarrying, the grieving process, and your plans for future pregnancy. °What are the causes? °A miscarriage may be caused by: °· Problems with the genes or chromosomes of the fetus. These problems make it impossible for the baby to develop normally. They are often the result of random errors that occur early in the development of the baby, and are not passed from parent to child (not inherited). °· Infection of the cervix or uterus. °· Conditions that affect hormone balance in the body. °· Problems with the cervix, such as the cervix opening and thinning before pregnancy is at term (cervical insufficiency). °· Problems with the uterus. These may include: °? A uterus with an abnormal shape. °? Fibroids in the uterus. °? Congenital abnormalities. These are problems that were present at birth. °· Certain medical conditions. °· Smoking, drinking alcohol, or using drugs. °· Injury (trauma). °In many cases, the cause of a miscarriage is not known. °What are the signs or symptoms? °Symptoms of this condition include: °· Vaginal bleeding or spotting, with or without cramps or pain. °· Pain or cramping in the abdomen or lower back. °· Passing fluid, tissue, or blood clots from the vagina. °How is this diagnosed? °This  condition may be diagnosed based on: °· A physical exam. °· Ultrasound. °· Blood tests. °· Urine tests. °How is this treated? °Treatment for a miscarriage is sometimes not necessary if you naturally pass all the tissue that was in your uterus. If necessary, this condition may be treated with: °· Dilation and curettage (D&C). This is a procedure in which the cervix is stretched open and the lining of the uterus (endometrium) is scraped. This is done only if tissue from the fetus or placenta remains in the body (incomplete miscarriage). °· Medicines, such as: °? Antibiotic medicine, to treat infection. °? Medicine to help the body pass any remaining tissue. °? Medicine to reduce (contract) the size of the uterus. These medicines may be given if you have a lot of bleeding. °If you have Rh negative blood and your baby was Rh positive, you will need a shot of a medicine called Rh immunoglobulinto protect your future babies from Rh blood problems. "Rh-negative" and "Rh-positive" refer to whether or not the blood has a specific protein found on the surface of red blood cells (Rh factor). °Follow these instructions at home: °Medicines ° °· Take over-the-counter and prescription medicines only as told by your health care provider. °· If you were prescribed antibiotic medicine, take it as told by your health care provider. Do not stop taking the antibiotic even if you start to feel better. °· Do not take NSAIDs, such as aspirin and ibuprofen, unless they are approved by your health care provider. These   medicines can cause bleeding. °Activity °· Rest as directed. Ask your health care provider what activities are safe for you. °· Have someone help with home and family responsibilities during this time. °General instructions °· Keep track of the number of sanitary pads you use each day and how soaked (saturated) they are. Write down this information. °· Monitor the amount of tissue or blood clots that you pass from your vagina.  Save any large amounts of tissue for your health care provider to examine. °· Do not use tampons, douche, or have sex until your health care provider approves. °· To help you and your partner with the process of grieving, talk with your health care provider or seek counseling. °· When you are ready, meet with your health care provider to discuss any important steps you should take for your health. Also, discuss steps you should take to have a healthy pregnancy in the future. °· Keep all follow-up visits as told by your health care provider. This is important. °Where to find more information °· The American Congress of Obstetricians and Gynecologists: www.acog.org °· U.S. Department of Health and Human Services Office of Women’s Health: www.womenshealth.gov °Contact a health care provider if: °· You have a fever or chills. °· You have a foul smelling vaginal discharge. °· You have more bleeding instead of less. °Get help right away if: °· You have severe cramps or pain in your back or abdomen. °· You pass blood clots or tissue from your vagina that is walnut-sized or larger. °· You soak more than 1 regular sanitary pad in an hour. °· You become light-headed or weak. °· You pass out. °· You have feelings of sadness that take over your thoughts, or you have thoughts of hurting yourself. °Summary °· Most miscarriages happen in the first 3 months of pregnancy. Sometimes miscarriage happens before a woman even knows that she is pregnant. °· Follow your health care provider's instruction for home care. Keep all follow-up appointments. °· To help you and your partner with the process of grieving, talk with your health care provider or seek counseling. °This information is not intended to replace advice given to you by your health care provider. Make sure you discuss any questions you have with your health care provider. °Document Revised: 08/17/2018 Document Reviewed: 05/31/2016 °Elsevier Patient Education © 2020 Elsevier  Inc. ° °

## 2019-10-21 NOTE — MAU Note (Signed)
Presents with c/o lower abdominal & back pain.  Reports moderate amount VB & passing clots.  States informed having miscarriage this past Saturday.

## 2019-11-07 ENCOUNTER — Ambulatory Visit: Payer: Self-pay | Admitting: Obstetrics & Gynecology

## 2019-11-18 ENCOUNTER — Encounter: Payer: Self-pay | Admitting: General Practice

## 2019-11-18 DIAGNOSIS — O039 Complete or unspecified spontaneous abortion without complication: Secondary | ICD-10-CM

## 2022-01-05 IMAGING — US US OB < 14 WEEKS - US OB TV
1 series · 15 of 28 positions shown · non-contrast
Comparison: None.

CLINICAL DATA: Vaginal bleeding with positive beta HCG

EXAM:
OBSTETRIC <14 WK US AND TRANSVAGINAL OB US
TECHNIQUE: Both transabdominal and transvaginal ultrasound examinations were
performed for complete evaluation of the gestation as well as the
maternal uterus, adnexal regions, and pelvic cul-de-sac.
Transvaginal technique was performed to assess early pregnancy.

[Series 1: us ob < 14 weeks - us ob tv · 55 acquisitions, 15 frames shown]
[im 1/55]
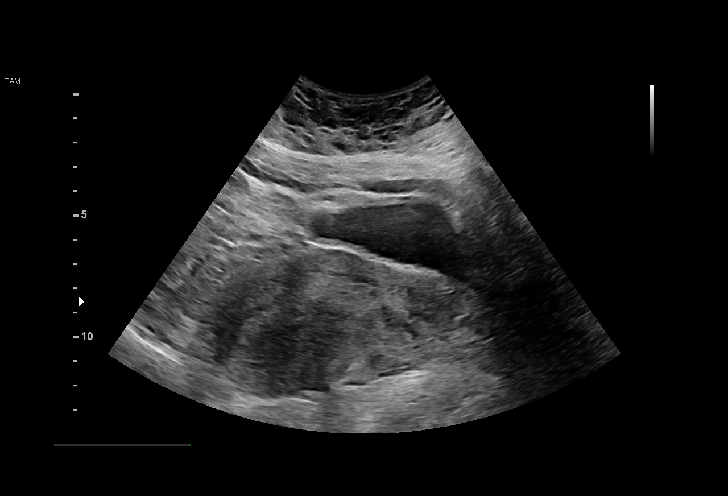
[im 5/55]
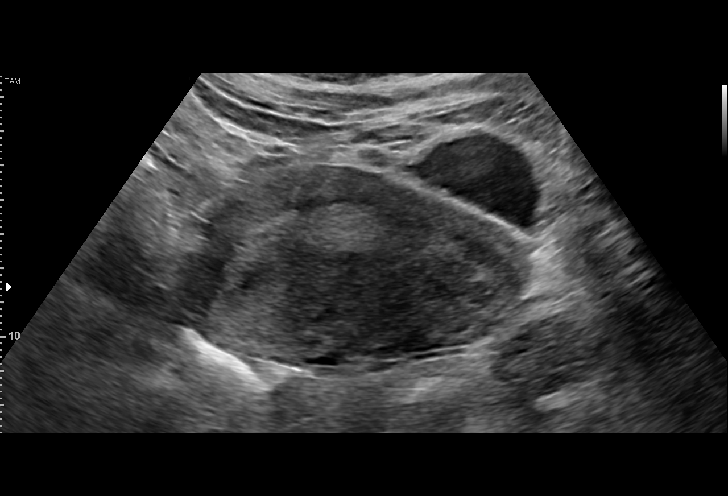
[im 9/55]
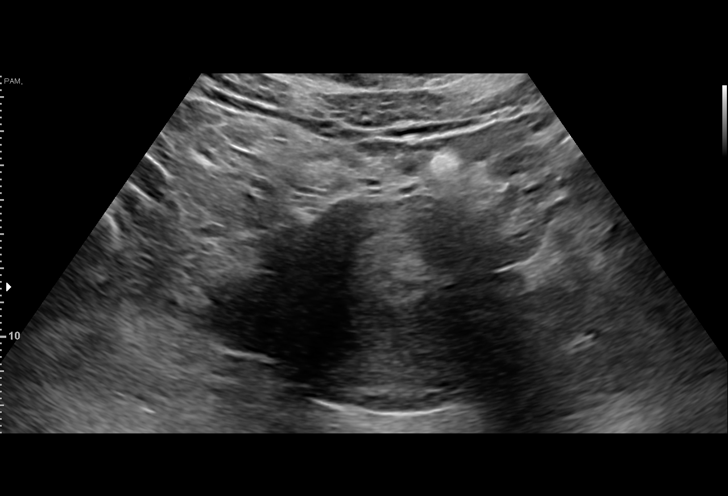
[im 13/55]
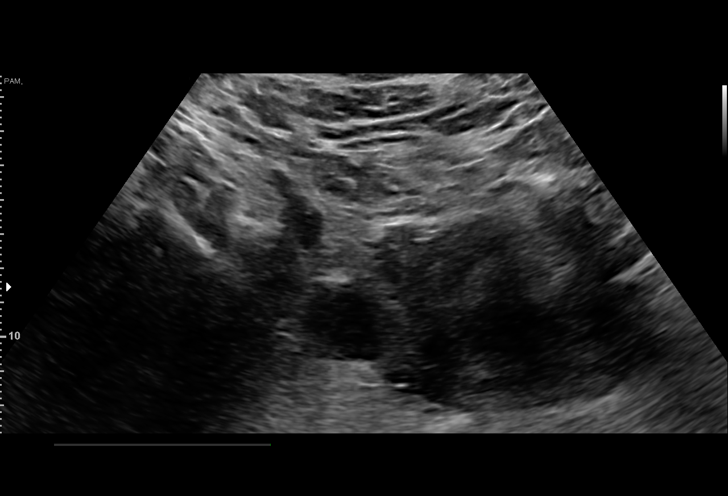
[im 17/55]
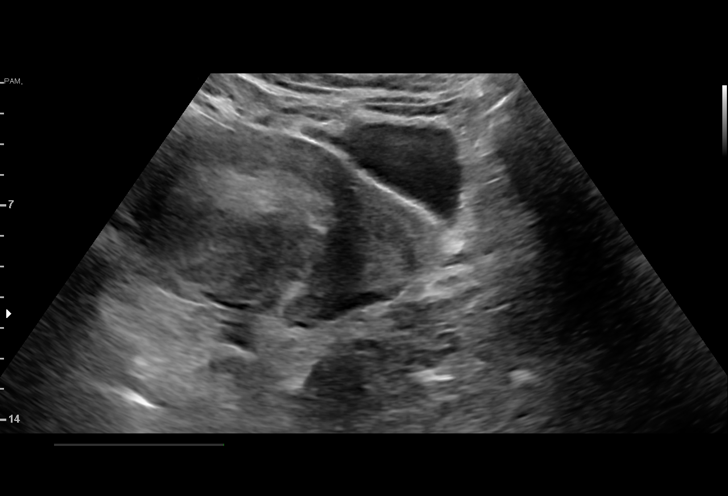
[im 21/55]
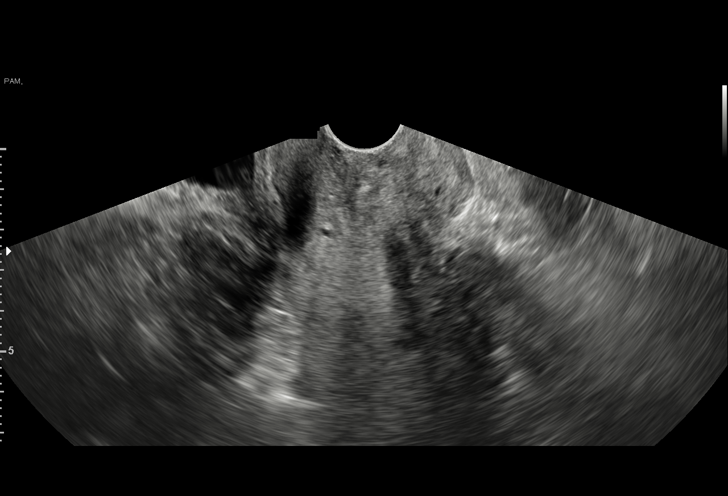
[im 25/55]
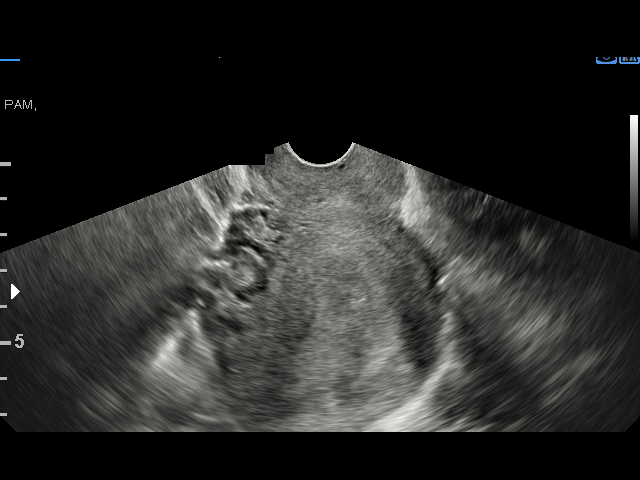
[im 29/55]
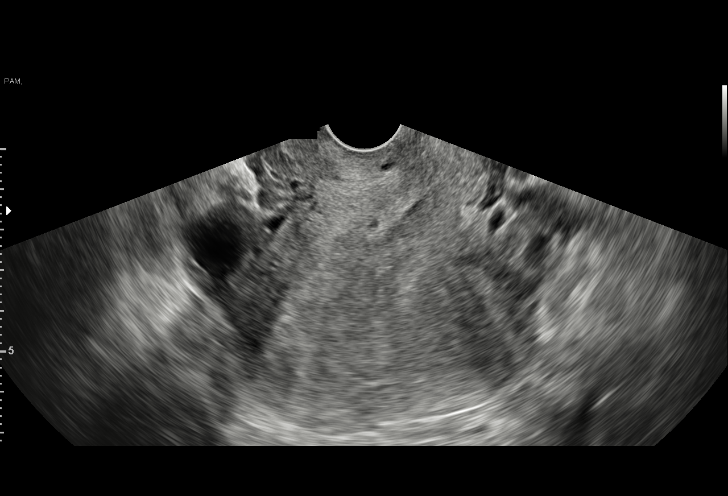
[im 31/55]
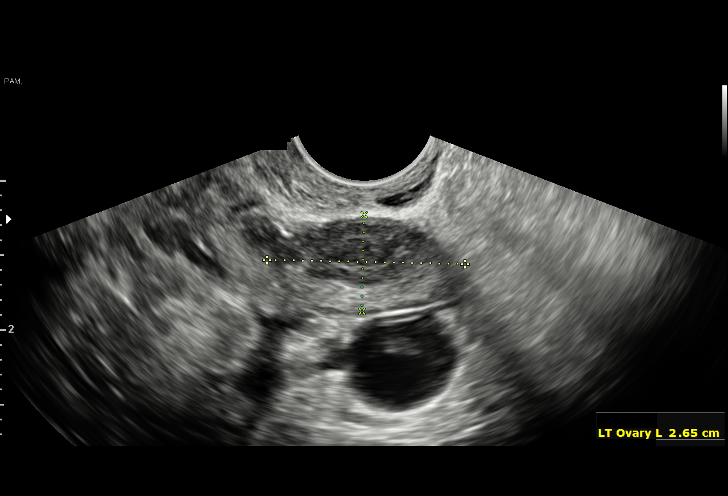
[im 35/55]
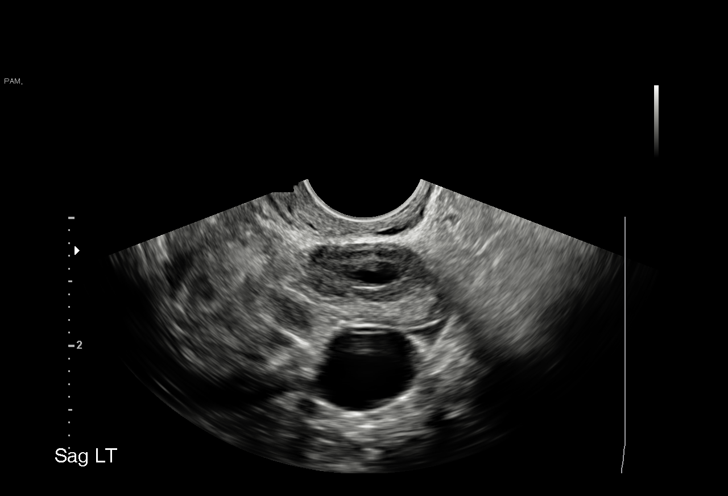
[im 39/55]
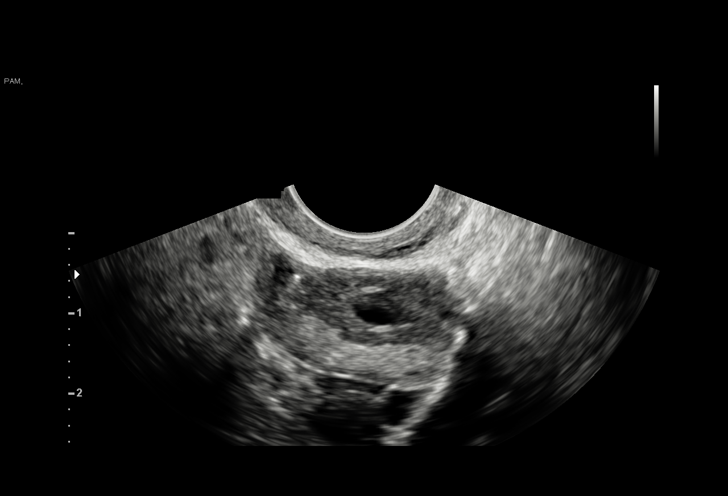
[im 43/55]
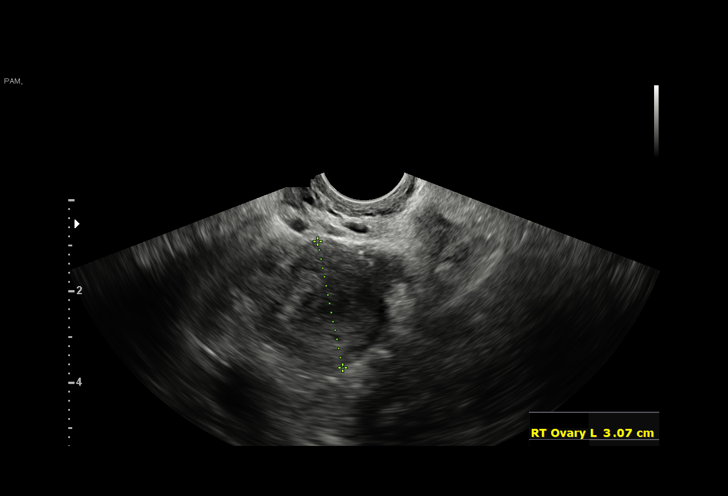
[im 47/55]
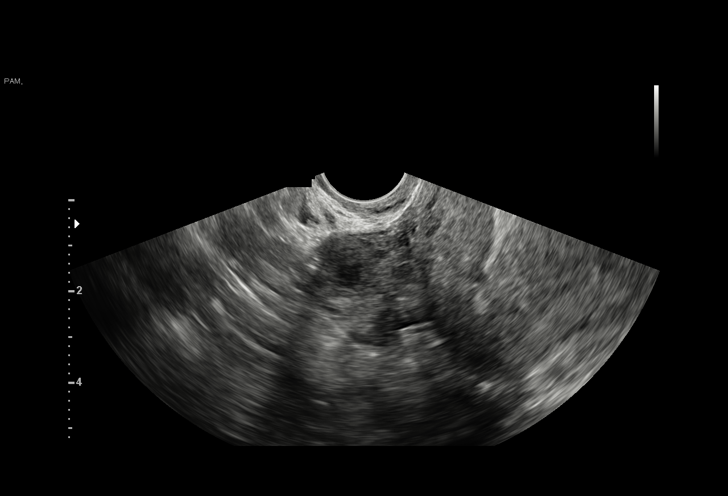
[im 51/55]
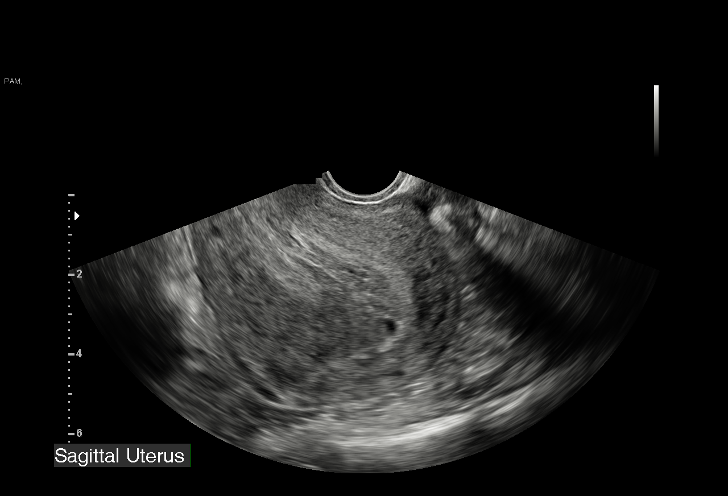
[im 55/55]
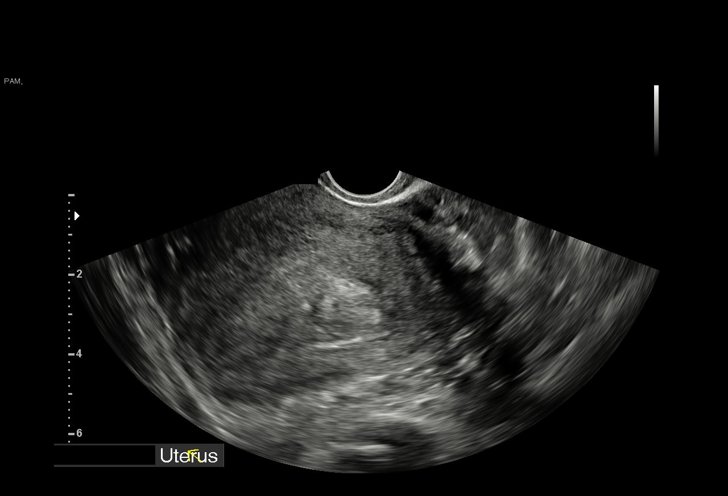

[15 of 28 positions shown; findings below may reference images not displayed]

FINDINGS: Intrauterine gestational sac: Not visualized

Yolk sac:  Not visualized

Embryo:  Not visualized

Cardiac Activity: Not visualized

Subchorionic hemorrhage:  None visualized.

Maternal uterus/adnexae: No intrauterine device is evident within
the uterus. The cervical os is closed. Left ovary measures 2.7 x
x 2.3 cm. There is a corpus luteum on the left measuring 1.5 x 1.5 x
1.5 cm. Right ovary measures 3.1 x 2.8 x 2.5 cm. There is trace free
pelvic fluid.
IMPRESSION: No intrauterine gestation appreciable currently. Given positive beta
HCG value, differential considerations must include intrauterine
gestation too early to be seen by either transabdominal transvaginal
technique; recent spontaneous abortion; possible ectopic gestation.
This circumstance warrants close clinical and laboratory
surveillance. Timing of repeat ultrasound in large part will depend
on beta HCG values going forward.

No intrauterine device evident.

Apparent corpus luteum left ovary. No other extrauterine pelvic
mass. Trace free pelvic fluid may be physiologic.

## 2022-01-07 IMAGING — US US OB TRANSVAGINAL
1 series · 15 of 28 positions shown · non-contrast
Comparison: Obstetric ultrasound 10/19/2019

CLINICAL DATA: Persistent back and pelvic pain with vaginal
bleeding. LMP 09/28/2019. Presumptive recent spontaneous abortion.

EXAM:
TRANSVAGINAL OB ULTRASOUND
TECHNIQUE: Transvaginal ultrasound was performed for complete evaluation of the
gestation as well as the maternal uterus, adnexal regions, and
pelvic cul-de-sac.

[Series 1: us ob transvaginal · 15 of 30 slices shown]
[im 1/30]
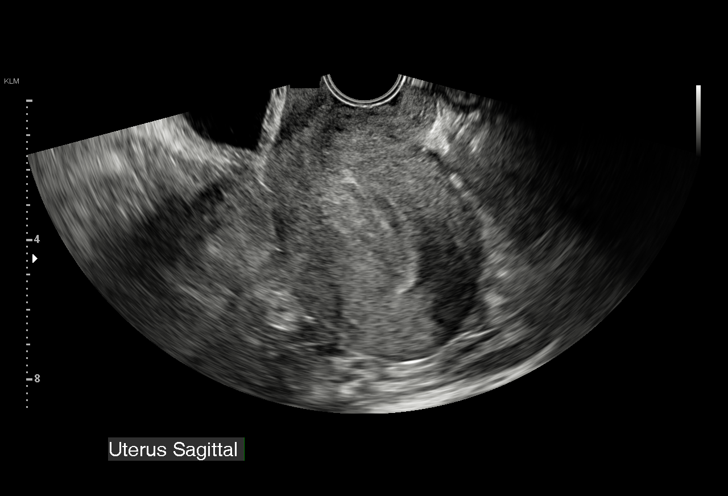
[im 3/30]
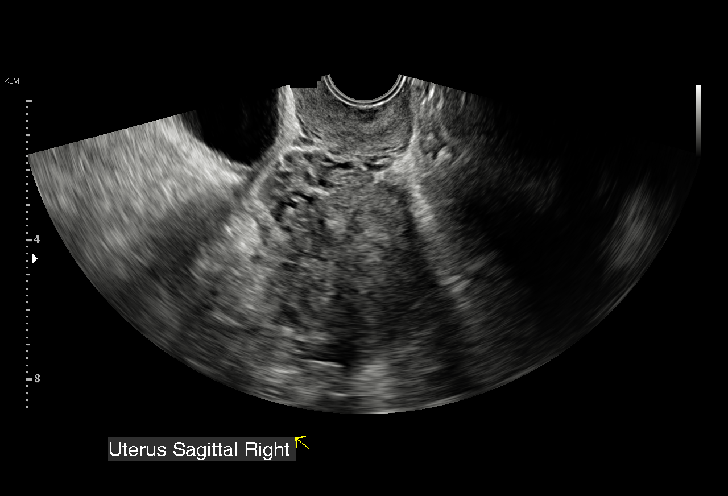
[im 5/30]
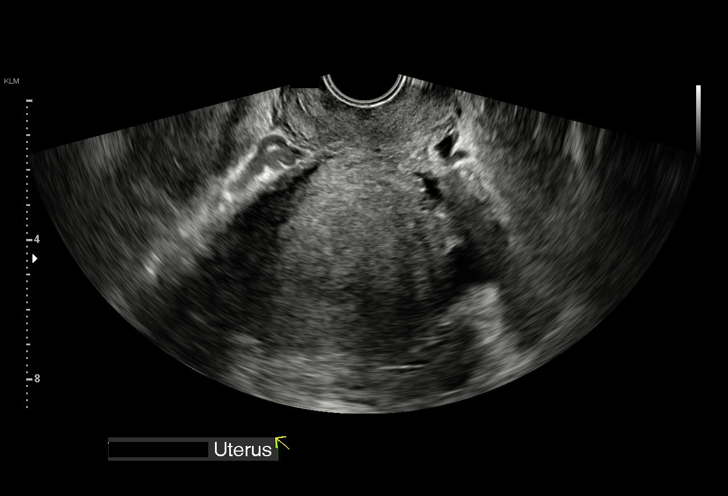
[im 7/30]
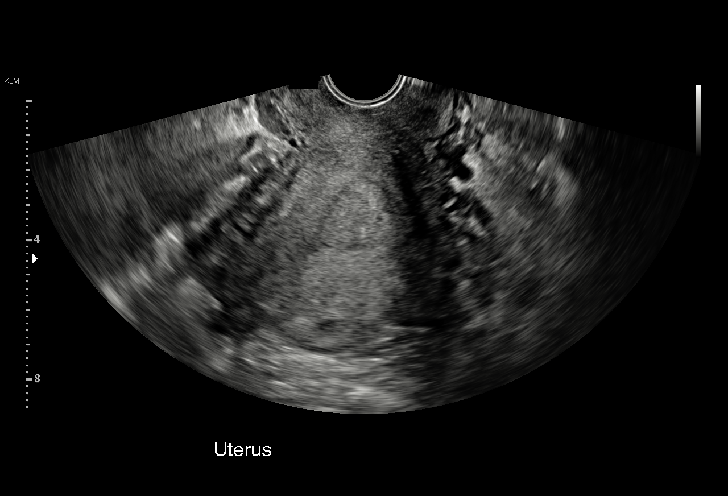
[im 9/30]
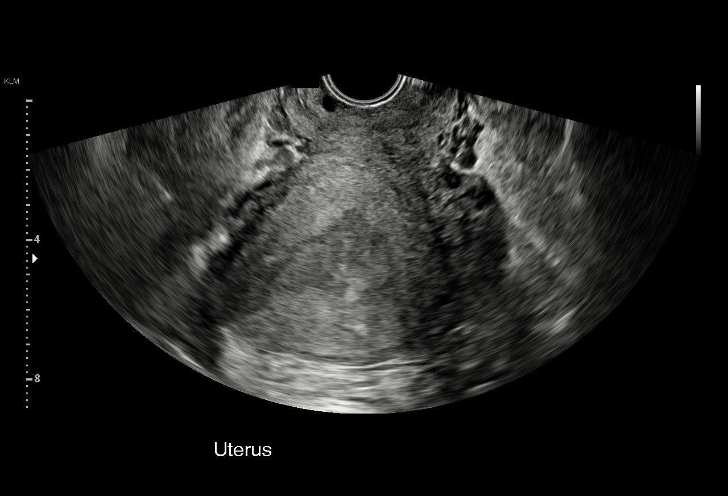
[im 11/30]
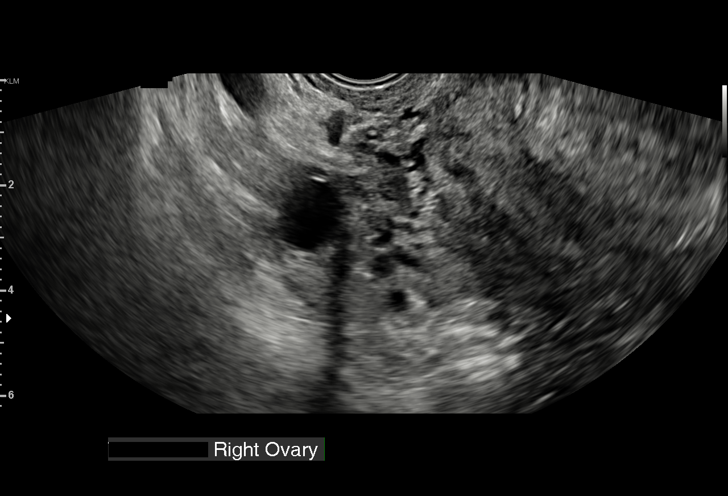
[im 13/30]
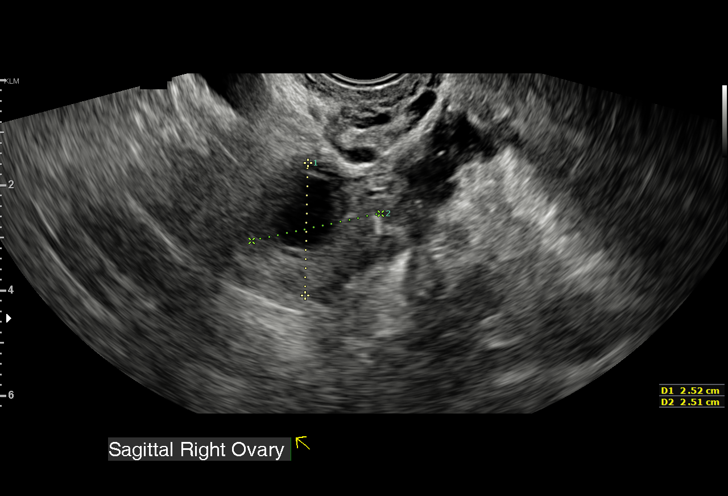
[im 16/30]
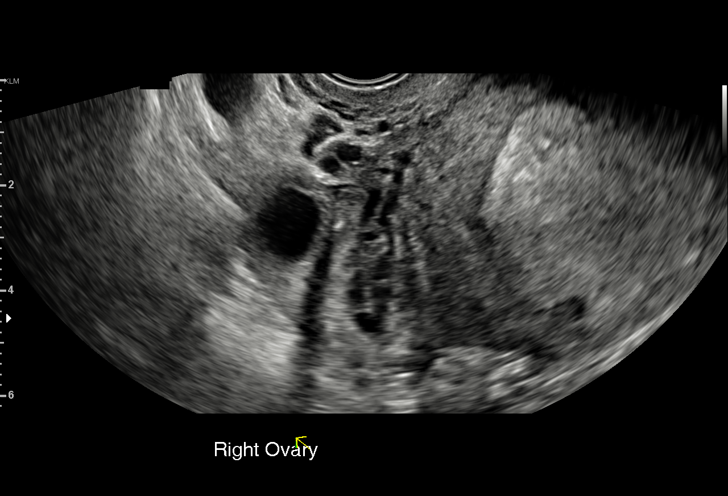
[im 17/30]
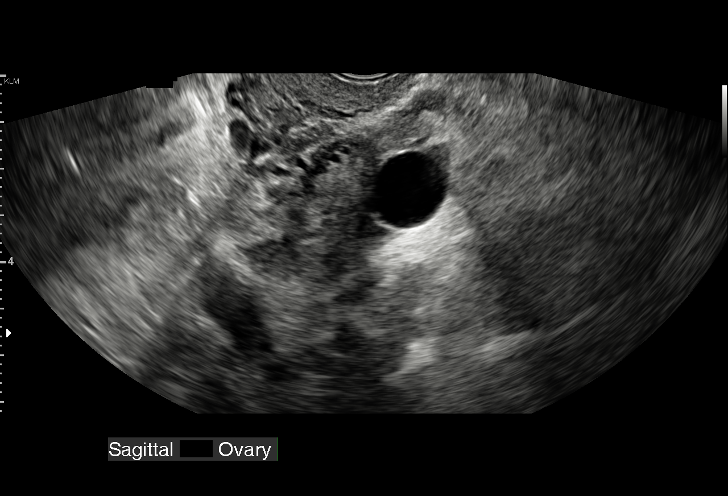
[im 19/30]
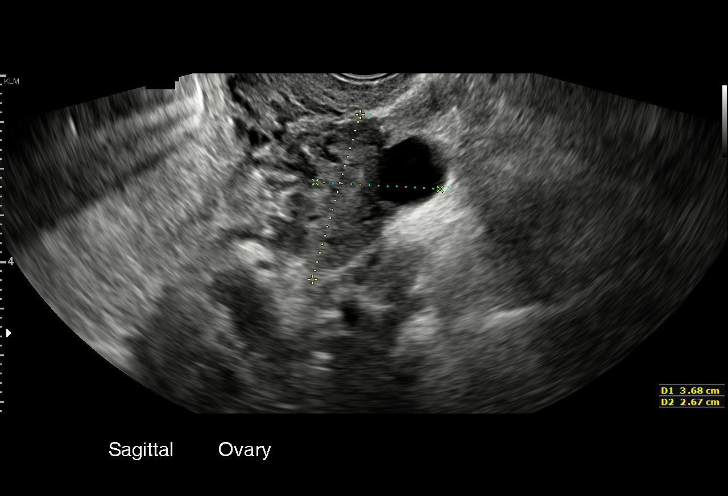
[im 21/30]
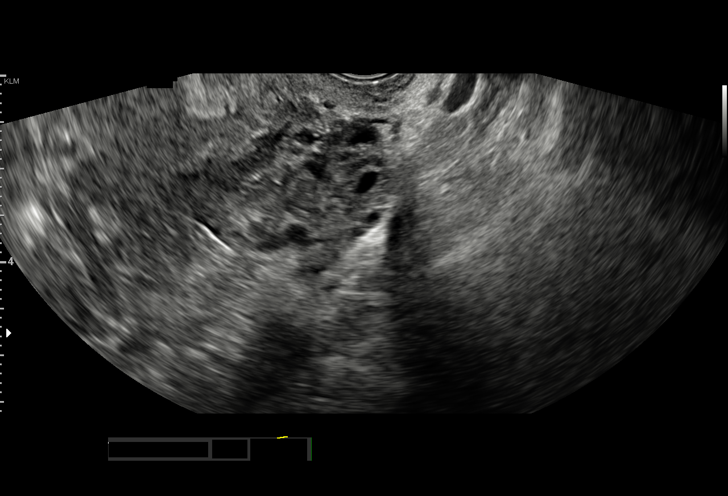
[im 23/30]
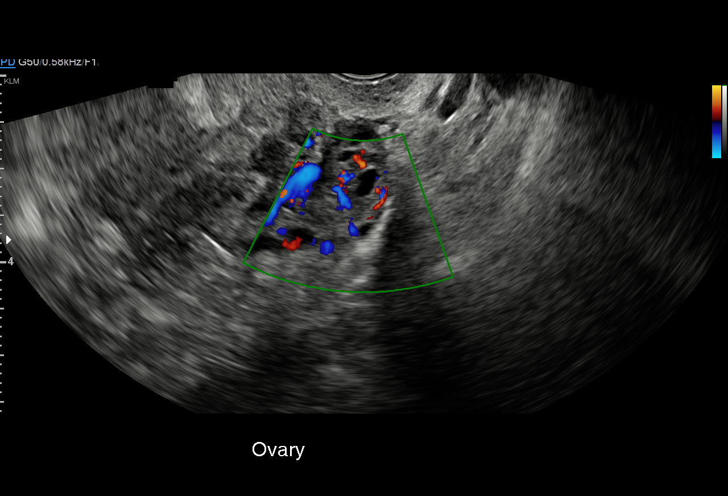
[im 25/30]
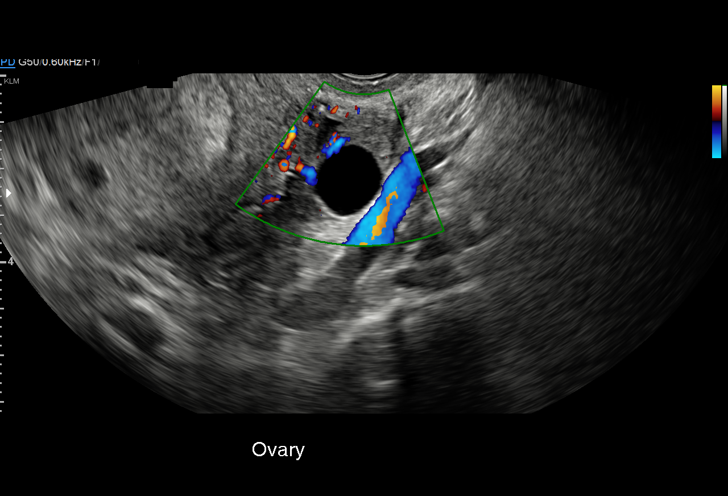
[im 27/30]
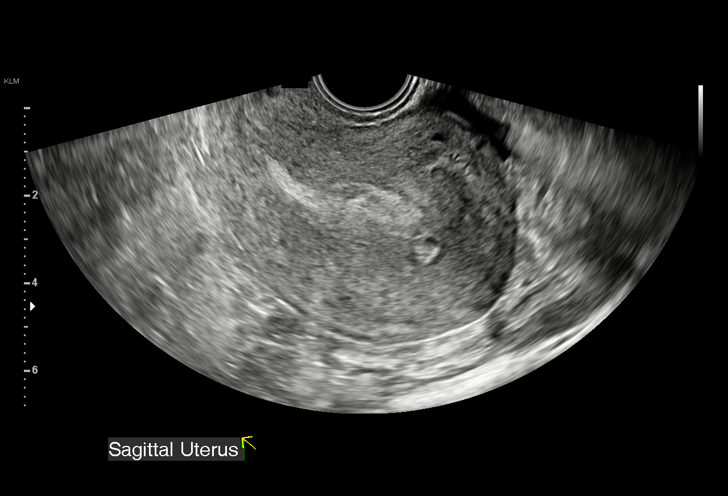
[im 30/30]
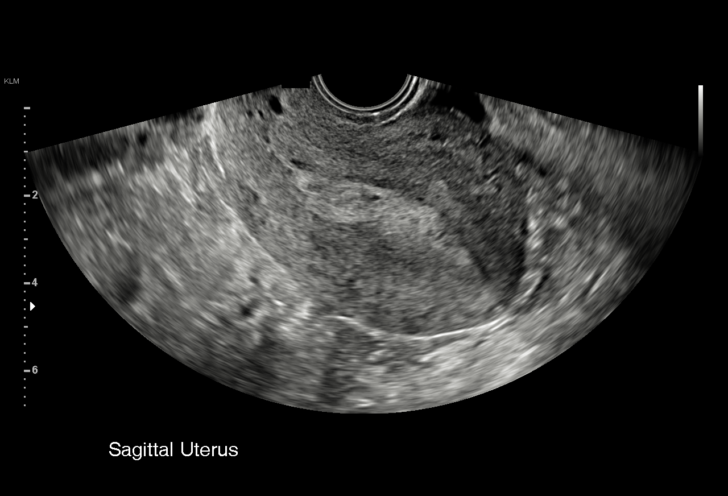

[15 of 28 positions shown; findings below may reference images not displayed]

FINDINGS: Intrauterine gestational sac: None.

Yolk sac:  None.

Embryo:  None.

Cardiac Activity: None

Subchorionic hemorrhage: None. There is irregular thickening of the
endometrium with increased blood flow in the subendometrial uterus.

Maternal uterus/adnexae: Both ovaries are visualized. There is a
small corpus luteal cyst on the left. No suspicious adnexal
findings. No significant free pelvic fluid.
IMPRESSION: No intrauterine gestational sac, yolk sac, fetal pole, or cardiac
activity visualized. Differential considerations include
intrauterine gestation too early to be sonographically visualized,
spontaneous abortion, or ectopic pregnancy. Spontaneous abortion
favored. Follow-up beta HCG level drawn today is still pending.

## 2022-08-22 ENCOUNTER — Ambulatory Visit (HOSPITAL_COMMUNITY)
Admission: EM | Admit: 2022-08-22 | Discharge: 2022-08-22 | Disposition: A | Discharge status: Home / Self Care | Payer: 59 | Attending: Nurse Practitioner | Admitting: Nurse Practitioner

## 2022-08-22 ENCOUNTER — Encounter (HOSPITAL_COMMUNITY): Payer: Self-pay

## 2022-08-22 DIAGNOSIS — J069 Acute upper respiratory infection, unspecified: Secondary | ICD-10-CM

## 2022-08-22 DIAGNOSIS — J209 Acute bronchitis, unspecified: Secondary | ICD-10-CM | POA: Insufficient documentation

## 2022-08-22 DIAGNOSIS — Z1152 Encounter for screening for COVID-19: Secondary | ICD-10-CM | POA: Diagnosis not present

## 2022-08-22 LAB — POCT RAPID STREP A, ED / UC: Streptococcus, Group A Screen (Direct): NEGATIVE

## 2022-08-22 LAB — POC INFLUENZA A AND B ANTIGEN (URGENT CARE ONLY)
INFLUENZA A ANTIGEN, POC: NEGATIVE
INFLUENZA B ANTIGEN, POC: NEGATIVE

## 2022-08-22 LAB — SARS CORONAVIRUS 2 (TAT 6-24 HRS): SARS Coronavirus 2: NEGATIVE

## 2022-08-22 MED ORDER — METHYLPREDNISOLONE 4 MG PO TBPK: ORAL_TABLET | ORAL | 0 refills | Status: AC

## 2022-08-22 MED ORDER — DM-GUAIFENESIN ER 30-600 MG PO TB12: 1.0000 | ORAL_TABLET | Freq: Two times a day (BID) | ORAL | 0 refills | Status: AC

## 2022-08-22 NOTE — ED Triage Notes (Signed)
Pt presents to the office for sore throat, cough and congestion. Loss of taste and smell. Pt is taking Tylenol.

## 2022-08-22 NOTE — Discharge Instructions (Addendum)
Your COVID is pending. Your results will show in your MyChart. Any positive results will result in a phone call from a nurse with next steps in treatment and recommendations.   Influenza and Strep Test are negative. You may have an Upper Respiratory Infection. You have been prescribed Medrol Dosepak for the persistent cough. Encourage the use of Mucinex DM  We encourage conservative treatment with symptom relief. We encourage you to use Tylenol alternating with Ibuprofen for your fever if not contraindicated. (Remember to use as directed do not exceed daily dosing recommendations) We also encourage salt water gargles for your sore throat. You should also consider throat lozenges and chloraseptic spray.  Your cough can be soothed with a cough suppressant. We have prescribed you a cough suppressant to be taken as  directed. Encourage daily use of antihistamine for seasonal allergies

## 2022-08-22 NOTE — ED Provider Notes (Signed)
[facility]    CSN: [id number] Arrival date & time: 08/22/22  7829      History   Chief Complaint Chief Complaint  Patient presents with   Sore Throat   Headache   Cough    HPI Brenda Cline is a 32 y.o. female.   HPI  She is in today with a 1-week head congestion, eye irritation, sneezing, sore throat and loss of taste or smell, body aches.  She reports that she is having ear pain and chest congestion.  She denies any fever, chills.  She denies any active exposures.  She reports that she has tried allergy medication which was not very effective.  She is also taking Tylenol. Past Medical History:  Diagnosis Date   No pertinent past medical history     Patient Active Problem List   Diagnosis Date Noted   Active labor 10/21/2014    Past Surgical History:  Procedure Laterality Date   NO PAST SURGERIES      OB History     Gravida  4   Para  3   Term  3   Preterm  0   AB  0   Living  3      SAB  0   IAB  0   Ectopic  0   Multiple  0   Live Births  3            Home Medications    Prior to Admission medications   Medication Sig Start Date End Date Taking? Authorizing Provider  dextromethorphan-guaiFENesin (MUCINEX DM) 30-600 MG 12hr tablet Take 1 tablet by mouth 2 (two) times daily. 08/22/22  Yes Barbette Merino, NP  methylPREDNISolone (MEDROL) 4 MG TBPK tablet Follow package instructions. 08/22/22 08/28/22 Yes Barbette Merino, NP    Family History Family History  Problem Relation Age of Onset   Other Neg Hx    Hearing loss Neg Hx     Social History Social History   Tobacco Use   Smoking status: Never   Smokeless tobacco: Never  Vaping Use   Vaping Use: Never used  Substance Use Topics   Alcohol use: No   Drug use: No     Allergies   Penicillins   Review of Systems Review of Systems   Physical Exam Triage Vital Signs ED Triage Vitals [08/22/22 1038]  Enc Vitals Group     BP 116/83     Pulse Rate 93      Resp 19     Temp 97.7 F (36.5 C)     Temp src      SpO2 95 %     Weight      Height      Head Circumference      Peak Flow      Pain Score      Pain Loc      Pain Edu?      Excl. in GC?    No data found.  Updated Vital Signs BP 116/83 (BP Location: Left Arm)   Pulse 93   Temp 97.7 F (36.5 C)   Resp 19   SpO2 95%   Visual Acuity Right Eye Distance:   Left Eye Distance:   Bilateral Distance:    Right Eye Near:   Left Eye Near:    Bilateral Near:     Physical Exam Constitutional:      Appearance: She is obese.  HENT:     Head: Normocephalic and  atraumatic.     Right Ear: Tympanic membrane normal.     Left Ear: Tympanic membrane normal.     Nose: Congestion present.     Mouth/Throat:     Mouth: Mucous membranes are moist. No oral lesions.     Pharynx: No oropharyngeal exudate.     Tonsils: No tonsillar exudate or tonsillar abscesses. 1+ on the right. 1+ on the left.  Eyes:     Conjunctiva/sclera: Conjunctivae normal.  Neck:     Comments: Tenderness Cardiovascular:     Rate and Rhythm: Normal rate.     Heart sounds: Normal heart sounds.  Pulmonary:     Effort: Pulmonary effort is normal.  Skin:    General: Skin is warm.     Capillary Refill: Capillary refill takes less than 2 seconds.  Neurological:     General: No focal deficit present.     Mental Status: She is alert and oriented to person, place, and time.  Psychiatric:        Mood and Affect: Mood normal.      UC Treatments / Results  Labs (all labs ordered are listed, but only abnormal results are displayed) Labs Reviewed  SARS CORONAVIRUS 2 (TAT 6-24 HRS)  POCT RAPID STREP A, ED / UC  POC INFLUENZA A AND B ANTIGEN (URGENT CARE ONLY)    EKG   Radiology No results found.  Procedures Procedures (including critical care time)  Medications Ordered in UC Medications - No data to display  Initial Impression / Assessment and Plan / UC Course  I have reviewed the triage vital  signs and the nursing notes.  Pertinent labs & imaging results that were available during my care of the patient were reviewed by me and considered in my medical decision making (see chart for details).     Cough and congestion Final Clinical Impressions(s) / UC Diagnoses   Final diagnoses:  Viral upper respiratory tract infection  Acute bronchitis, unspecified organism     Discharge Instructions      Your COVID is pending. Your results will show in your MyChart. Any positive results will result in a phone call from a nurse with next steps in treatment and recommendations.   Influenza and Strep Test are negative. You may have an Upper Respiratory Infection. You have been prescribed Medrol Dosepak for the persistent cough. Encourage the use of Mucinex DM  We encourage conservative treatment with symptom relief. We encourage you to use Tylenol alternating with Ibuprofen for your fever if not contraindicated. (Remember to use as directed do not exceed daily dosing recommendations) We also encourage salt water gargles for your sore throat. You should also consider throat lozenges and chloraseptic spray.  Your cough can be soothed with a cough suppressant. We have prescribed you a cough suppressant to be taken as  directed. Encourage daily use of antihistamine for seasonal allergies       ED Prescriptions     Medication Sig Dispense Auth. Provider   methylPREDNISolone (MEDROL) 4 MG TBPK tablet Follow package instructions. 21 tablet Thad Ranger M, NP   dextromethorphan-guaiFENesin [facility] DM) 30-600 MG 12hr tablet Take 1 tablet by mouth 2 (two) times daily. 14 tablet Barbette Merino, NP      PDMP not reviewed this encounter.   Thad Ranger [place], [place] 08/22/22 [phone]

## 2024-03-07 ENCOUNTER — Emergency Department (HOSPITAL_COMMUNITY)
Admission: EM | Admit: 2024-03-07 | Discharge: 2024-03-08 | Disposition: A | Discharge status: Home / Self Care | Payer: Self-pay

## 2024-03-07 ENCOUNTER — Encounter (HOSPITAL_COMMUNITY): Payer: Self-pay

## 2024-03-07 ENCOUNTER — Other Ambulatory Visit: Payer: Self-pay

## 2024-03-07 DIAGNOSIS — H60501 Unspecified acute noninfective otitis externa, right ear: Secondary | ICD-10-CM | POA: Insufficient documentation

## 2024-03-07 MED ORDER — KETOROLAC TROMETHAMINE 15 MG/ML IJ SOLN
15.0000 mg | Freq: Once | INTRAMUSCULAR | Status: AC
  Administered 2024-03-07: 15 mg via INTRAVENOUS
  Filled 2024-03-07: qty 1

## 2024-03-07 NOTE — ED Triage Notes (Signed)
 C/O right ear pain x 3 days.   Denies fever, denies other cold sympltoms

## 2024-03-07 NOTE — ED Provider Notes (Incomplete)
 [place] EMERGENCY DEPARTMENT AT [facility] Provider Note   CSN: [id number] Arrival date & time: 03/07/24  2010     Patient presents with: No chief complaint on file.   Brenda Cline is a 33 y.o. female presents to the ED with right ear pain that began 3 days ago. The patient describes her symptoms as pain in her right ear, and right side of her face that started gradually and have been progressively worsening since onset. Associated symptoms include subjective fevers, swelling to the right side of her face.  The patient reports no hearing loss, discharge from the ear canal, congestion, cough, sore throat, headache.  The patient states that she has tried over-the-counter pain management such as Tylenol  and ibuprofen , patient states that she picked up the eardrops today from [facility] and has tried those without relief.  No recent travel. No sick contacts.   {Add pertinent medical, surgical, social history, OB history to HPI:32947} HPI     Prior to Admission medications   Medication Sig Start Date End Date Taking? Authorizing Provider  dextromethorphan-guaiFENesin  (MUCINEX  DM) 30-600 MG 12hr tablet Take 1 tablet by mouth 2 (two) times daily. 08/22/22   Myrna Camelia HERO, NP    Allergies: Penicillins    Review of Systems  HENT:  Positive for ear pain.     Updated Vital Signs BP 117/74 (BP Location: Right Arm)   Pulse 84   Temp 98 F (36.7 C) (Oral)   Resp 17   Ht 5' 1 (1.549 m)   Wt 90.3 kg   SpO2 100%   BMI 37.60 kg/m   Physical Exam Vitals and nursing note reviewed.  Constitutional:      General: She is not in acute distress.    Appearance: Normal appearance.  HENT:     Head: Normocephalic and atraumatic.     Jaw: There is normal jaw occlusion. Pain on movement present.     Comments: Minor swelling noted to the right lateral portion of her face, around the anterior and posterior right ear, and down her neck around the left tonsillar lymph node .There is  no swelling around the right eye or vision impairment. There is no swelling around her mouth or airway involvement.     Right Ear: Hearing and tympanic membrane normal. Tenderness present. There is mastoid tenderness.     Left Ear: Hearing and tympanic membrane normal.     Ears:     Comments: Tenderness on palpation of the external ear, anterior and posterior tenderness on palpation.    Nose:     Right Sinus: No maxillary sinus tenderness or frontal sinus tenderness.     Left Sinus: No maxillary sinus tenderness or frontal sinus tenderness.     Mouth/Throat:     Mouth: Mucous membranes are moist.     Pharynx: Oropharynx is clear. Uvula midline. No pharyngeal swelling, oropharyngeal exudate, posterior oropharyngeal erythema, uvula swelling or postnasal drip.     Tonsils: No tonsillar exudate or tonsillar abscesses. 1+ on the right. 1+ on the left.  Eyes:     General: Lids are normal. Vision grossly intact. Gaze aligned appropriately. No visual field deficit.    Extraocular Movements: Extraocular movements intact.     Conjunctiva/sclera: Conjunctivae normal.     Pupils: Pupils are equal, round, and reactive to light.  Neck:     Trachea: Trachea normal.  Cardiovascular:     Rate and Rhythm: Normal rate and regular rhythm.     Pulses:  Normal pulses.  Pulmonary:     Effort: Pulmonary effort is normal. No respiratory distress.     Breath sounds: Normal breath sounds.     Comments: Patient has no difficulty speaking in complete sentences. Abdominal:     General: Abdomen is flat.     Palpations: Abdomen is soft.     Tenderness: There is no abdominal tenderness.  Musculoskeletal:        General: Normal range of motion.     Cervical back: Normal range of motion. No rigidity or crepitus. No spinous process tenderness or muscular tenderness. Normal range of motion.  Lymphadenopathy:     Head:     Right side of head: Submandibular, tonsillar and preauricular adenopathy present.     Cervical:  Cervical adenopathy present.     Right cervical: Superficial cervical adenopathy present.     Comments: Enlarged lymph nodes on the right with pain on palpation.  Skin:    General: Skin is warm and dry.     Capillary Refill: Capillary refill takes less than 2 seconds.  Neurological:     General: No focal deficit present.     Mental Status: She is alert. Mental status is at baseline.  Psychiatric:        Mood and Affect: Mood normal.     (all labs ordered are listed, but only abnormal results are displayed) Labs Reviewed - No data to display  EKG: None  Radiology: No results found.  {Document cardiac monitor, telemetry assessment procedure when appropriate:32947} Procedures   Medications Ordered in the ED - No data to display    {Click here for ABCD2, HEART and other calculators REFRESH Note before signing:1}                              Medical Decision Making  Patient presents to the ED for concern of right ear pain, this involves an extensive number of treatment options, and is a complaint that carries with it a high risk of complications and morbidity.    The differential diagnosis includes: Otitis media/externa Mastoiditis Tonsillar abscess  Co morbidities that complicate the patient evaluation: None  Additional history obtained: Additional history obtained from Outside Medical Records External records from outside source obtained and reviewed including medical history, surgical history, medications, allergies. The patient was a reliable historian, providing a clear, detailed, and consistent account of the presenting symptoms and relevant medical history. The information was obtained directly from the patient and statements were documented in the patient's own words when possible. No discrepancies were noted between the history provided and available collateral sources.     Lab Tests: I ordered, and personally interpreted labs.   The pertinent results include:   I-stat for CT  Imaging Studies ordered: I ordered imaging studies including: CT temporal bones with contrast I independently visualized and interpreted imaging which showed: *** I agree with the radiologist interpretation  Medicines ordered and prescription drug management: I ordered medications: Toradol 15 mg for pain relief Reevaluation of the patient after these medicines showed that the patient {resolved/improved/worsened:23923::improved} I have reviewed the patients home medicines and have made adjustments as needed  Test Considered: Diagnostic testing was considered based on the patient's presenting symptoms, risk factors, and initial clinical assessment.  For [chest pain] initial evaluation included [ECG and high-sensitivity cardiac troponin (hs-cTn) assays] Given [intermediate] risk, further noninvasive testing such as [coronary CT angiography (CCTA) was considered, with the decision tailored to  institutional resources and patient preference.For suspected pulmonary embolism, clinical probability was assessed using validated tools (e.g., Wells score), with D-dimer testing performed in low-risk cases and CT pulmonary angiography reserved for higher-risk presentations.    Shared decision making was employed to discuss the benefits and risks of diagnostic tests, including potential for risks and benefits to include [incidental findings radiation exposure] Patient values and preferences were incorporated into the decision process.   The approach to diagnostic testing prioritized exclusion of life-threatening conditions  Critical Interventions: ***  Consultations Obtained:   Consultation was obtained from the [specialty] service regarding the evaluation and management of this patient's [primary clinical issue]. The consultant was contacted by phone at [time], and the case was discussed in detail, including relevant history, physical findings, diagnostic results, and the specific  clinical question. The consultant recommended [summary of recommendations], which was incorporated into the ongoing care plan. Both teams agreed on the disposition and follow-up needs.    Diagnostic tools:  Clinical decision tools such as [MDCalc] were used in the emergency department to support diagnostic accuracy, risk stratification, and disposition planning.   Problem List / ED Course: Problem List: Right ear pain Emergency Department Course: The patient presented with ***. Initial assessment included history, physical exam, and review of prior medical records. ECG and troponin were ordered and reviewed; both were negative for acute ischemia. Chest X-ray was performed to rule out other causes; no acute findings. Given the patient's risk factors (hypertension, diabetes, hyperlipidemia), the decision was made to monitor in the ED with serial troponins and ECGs.    Differential diagnosis considered included acute coronary syndrome, pulmonary embolism, and musculoskeletal pain. Shared decision making was employed regarding further diagnostic testing and disposition, balancing patient autonomy and network engineer.[1] The patient was counseled on risks and benefits of admission versus outpatient follow-up.    Consultation with cardiology was obtained; recommendation was for observation and repeat troponin in 3 hours. No acute intervention required at this time. Patient remained hemodynamically stable throughout ED stay.    Reevaluation: After the interventions noted above, I reevaluated the patient and found that they have :{resolved/improved/worsened:23923::improved}  Dispostion: After consideration of the diagnostic results and the patients response to treatment, I feel that the patent would benefit from discharge home with follow-up with for further valuation and care of *** Clinical Assessment:    Working diagnosis: *** Disposition Plan: The patient is medically stable for discharge  from the Emergency Department at this time. Vital signs are within normal limits, and the patient is alert, oriented, and in no acute distress. Diagnostic evaluation has been completed with no findings necessitating hospital admission or further emergent workup.  Communication:   Patient and family informed of disposition decision and rationale. Questions addressed.  The diagnostic results and clinical impression were discussed with the patient and  at bedside and the patient demonstrated understanding.      {Document critical care time when appropriate  Document review of labs and clinical decision tools ie CHADS2VASC2, etc  Document your independent review of radiology images and any outside records  Document your discussion with family members, caretakers and with consultants  Document social determinants of health affecting pt's care  Document your decision making why or why not admission, treatments were needed:32947:::1}   Final diagnoses:  None    ED Discharge Orders     None

## 2024-03-07 NOTE — ED Provider Notes (Signed)
 [place] EMERGENCY DEPARTMENT AT [facility] Provider Note   CSN: [id number] Arrival date & time: 03/07/24  2010     Patient presents with: No chief complaint on file.  Brenda Cline is a 33 y.o. female who presents to the ED with right ear pain that began 3 days ago. The patient describes her symptoms as pain in her right ear and to the right side of her face. Patient states that the symptoms started gradually and have been progressively worsening since onset. Associated symptoms include subjective fevers and swelling to the right side of her face. The patient reports no hearing loss, discharge from the ear canal, congestion, cough, sore throat, or headache.  The patient states that she has tried over-the-counter pain management such as Tylenol  and ibuprofen , patient states that she picked up OTC eardrops today from [facility] and has tried those without relief.  No recent travel. No sick contacts.    HPI     Prior to Admission medications   Medication Sig Start Date End Date Taking? Authorizing Provider  ciprofloxacin-dexamethasone (CIPRODEX) OTIC suspension Place 4 drops into the right ear 2 (two) times daily for 7 days. 03/08/24 03/15/24 Yes Lailany Enoch L, PA  dextromethorphan-guaiFENesin  (MUCINEX  DM) 30-600 MG 12hr tablet Take 1 tablet by mouth 2 (two) times daily. 08/22/22   Myrna Camelia HERO, NP    Allergies: Penicillins    Review of Systems  HENT:  Positive for ear pain.     Updated Vital Signs BP 106/61   Pulse 73   Temp 98.2 F (36.8 C) (Oral)   Resp 17   Ht 5' 1 (1.549 m)   Wt 90.3 kg   SpO2 96%   BMI 37.60 kg/m   Physical Exam Vitals and nursing note reviewed.  Constitutional:      General: She is not in acute distress.    Appearance: Normal appearance.  HENT:     Head: Normocephalic and atraumatic.     Jaw: There is normal jaw occlusion. Pain on movement present.     Comments: Swelling to the right pre/post auricular and right  submandibular/tonsillar lymph nodes with tenderness on palpation. There is no swelling around the right eye or vision impairment.    Right Ear: Hearing and tympanic membrane normal. Tenderness present. There is mastoid tenderness.     Left Ear: Hearing and tympanic membrane normal.     Ears:     Comments: Tenderness on palpation of the external ear and mastoid process.    Nose:     Right Sinus: No maxillary sinus tenderness or frontal sinus tenderness.     Left Sinus: No maxillary sinus tenderness or frontal sinus tenderness.     Mouth/Throat:     Mouth: Mucous membranes are moist.     Pharynx: Oropharynx is clear. Uvula midline. No pharyngeal swelling, oropharyngeal exudate, posterior oropharyngeal erythema, uvula swelling or postnasal drip.     Tonsils: No tonsillar exudate or tonsillar abscesses. 1+ on the right. 1+ on the left.  Eyes:     General: Lids are normal. Vision grossly intact. Gaze aligned appropriately. No visual field deficit.    Extraocular Movements: Extraocular movements intact.     Conjunctiva/sclera: Conjunctivae normal.     Pupils: Pupils are equal, round, and reactive to light.  Neck:     Trachea: Trachea normal.  Cardiovascular:     Rate and Rhythm: Normal rate and regular rhythm.     Pulses: Normal pulses.  Pulmonary:  Effort: Pulmonary effort is normal. No respiratory distress.     Breath sounds: Normal breath sounds.     Comments: Patient has no difficulty speaking in complete sentences. Abdominal:     General: Abdomen is flat.     Palpations: Abdomen is soft.     Tenderness: There is no abdominal tenderness.  Musculoskeletal:        General: Normal range of motion.     Cervical back: Normal range of motion. No rigidity or crepitus. No spinous process tenderness or muscular tenderness. Normal range of motion.  Lymphadenopathy:     Head:     Right side of head: Submandibular, tonsillar, preauricular and posterior auricular adenopathy present.  Skin:     General: Skin is warm and dry.     Capillary Refill: Capillary refill takes less than 2 seconds.  Neurological:     General: No focal deficit present.     Mental Status: She is alert. Mental status is at baseline.  Psychiatric:        Mood and Affect: Mood normal.     (all labs ordered are listed, but only abnormal results are displayed) Labs Reviewed  POTASSIUM  I-STAT CHEM 8, ED    EKG: None  Radiology: CT Temporal Bones W Contrast Result Date: 03/08/2024 CLINICAL DATA:  Initial evaluation for acute right otalgia. EXAM: CT TEMPORAL BONES WITH CONTRAST TECHNIQUE: Axial and coronal plane CT imaging of the petrous temporal bones was performed with thin-collimation image reconstruction following intravenous contrast administration. Multiplanar CT image reconstructions were also generated. RADIATION DOSE REDUCTION: This exam was performed according to the departmental dose-optimization program which includes automated exposure control, adjustment of the mA and/or kV according to patient size and/or use of iterative reconstruction technique. CONTRAST:  75mL OMNIPAQUE IOHEXOL 350 MG/ML SOLN COMPARISON:  None Available. FINDINGS: Examination is somewhat technically limited by motion artifact. RIGHT TEMPORAL BONE External auditory canal: Right EAC is clear. Tympanic membrane thin and not well visualized. Middle ear cavity: Middle ear cavity is clear. Ossicular chain intact. Tegmen tympani intact. Inner ear structures: Cochlea of, vestibule, and semi circular canals within normal limits. Internal auditory and facial nerve canals: No abnormality about the right IAC. No CP angle mass. Facial nerve canal grossly intact and bony covered to the stylomastoid foramen. Mastoid air cells: Suspected trace right mastoid effusion, although evaluation somewhat limited by motion. No osseous erosion or coalescence. Sigmoid plate intact. LEFT TEMPORAL BONE External auditory canal: Left EAC is clear. Tympanic membrane  thin and not well visualized. Middle ear cavity: Middle ear cavity is clear. Ossicular chain intact. Tegmen tympani is grossly intact. Inner ear structures: Cochlea, vestibule, and semi circular canals within normal limits. Internal auditory and facial nerve canals: Left IAC within normal limits. No CP angle mass. Facial nerve canal grossly intact and bony covered to the stylomastoid foramen. Mastoid air cells: Suspected trace left mastoid effusion, although evaluation limited by motion. No coalescence or osseous erosion. Sigmoid plate intact. Vascular: Normal enhancement seen within the visualized major dural sinuses and jugular bulbs. Carotid canals intact without abnormality. Limited intracranial:  Unremarkable. Visible orbits/paranasal sinuses: Globes and orbital soft tissues within normal limits. Visualized paranasal sinuses are clear. Soft tissues: Unremarkable. No visible soft tissue swelling or inflammatory changes. IMPRESSION: 1. Suspected trace bilateral mastoid effusions, although evaluation somewhat limited by motion artifact. No osseous erosion or coalescence. 2. Otherwise unremarkable and normal CT of the temporal bones. No other acute abnormality identified. Electronically Signed   By: Morene Hoard  M.D.   On: 03/08/2024 02:04     Procedures   Medications Ordered in the ED  ketorolac (TORADOL) 15 MG/ML injection 15 mg (15 mg Intravenous Given 03/07/24 2337)  iohexol (OMNIPAQUE) 350 MG/ML injection 75 mL (75 mLs Intravenous Contrast Given 03/08/24 0153)    Clinical Course as of 03/08/24 0436  Thu Mar 07, 2024  2300 Patient with pre and post auricular lymphadenopathy which I believe is contributing to much of her associated tenderness. Clinical picture is c/w otitis externa; TTP noted when pulling on auricle, palpating tragus. No discrete external ear swelling. Right TM visualized without erythema, bulging, retraction, or perforation. Mild right mastoid TTP; however, no overlying  erythema. No submandibular swelling or crepitus. Oropharynx is clear without erythema, edema. Tolerating secretions with normal phonation. No tripoding or trismus.  [KH]  Fri Mar 08, 2024  0119 Creatinine 0.6 with normal BUN. Electrolytes reassuring aside from K >8.5 which is likely related to hemolysis. Repeat potassium ordered to verify.  [KH]    Clinical Course User Index [KH] Keith Sor, PA-C                                 Medical Decision Making  Patient presents to the ED for concern of right ear pain, this involves an extensive number of treatment options, and is a complaint that carries with it a high risk of complications and morbidity.    The differential diagnosis includes: Otitis media/externa Mastoiditis  Co morbidities that complicate the patient evaluation: None  Additional history obtained: Additional history obtained from Outside Medical Records External records from outside source obtained and reviewed including medical history, surgical history, medications, allergies. The patient was a reliable historian, providing a clear, detailed, and consistent account of the presenting symptoms and relevant medical history. The information was obtained directly from the patient and statements were documented in the patient's own words when possible. No discrepancies were noted between the history provided and available collateral sources.     Lab Tests: I ordered, and personally interpreted labs.   The pertinent results include:  I-stat for CT - showed potassium 8.5, likely hemolysis Potassium - 4.6   Imaging Studies ordered: I ordered imaging studies including: CT temporal bones with contrast I independently visualized and interpreted imaging which showed: Suspected trace bilateral mastoid effusions, although evaluation somewhat limited by motion artifact. No osseous erosion or coalescence. No other acute abnormality identified. I agree with the radiologist  interpretation  Medicines ordered and prescription drug management: I ordered medications: Toradol 15 mg for pain relief Reevaluation of the patient after these medicines showed that the patient improved I have reviewed the patients home medicines and have made adjustments as needed  Test Considered: No additional diagnostic testing was considered based on the patient's presenting symptoms, risk factors, and initial clinical assessment.  The approach to diagnostic testing prioritized exclusion of life-threatening conditions  Problem List / ED Course: Problem List: Right ear pain Emergency Department Course: The patient presented with right ear pain x 3 days. Initial assessment included history, physical exam, and review of prior medical records.  CT with contrast was completed due to mastoid tenderness with some associated adenopathy on physical exam - CT showed trace mastoid effusion bilaterally, making mastoiditis unlikely as a causative factor for patient's ear pain.  Given patient history, physical exam finding, imaging plan to discharge patient and treat for otitis externa.  Dispostion: After consideration of the diagnostic  results and the patients physical exam findings, I feel that the patent would benefit from discharge home with antibiotic regimen for otitis externa and follow-up with PCP for further evaluation and care. Clinical Assessment:    Working diagnosis: Otitis externa Disposition Plan: The patient is medically stable for discharge from the Emergency Department at this time. Vital signs are within normal limits, and the patient is alert, oriented, and in no acute distress. Diagnostic evaluation has been completed with no findings necessitating hospital admission or further emergent workup.  Communication:   Patient informed of disposition decision and rationale. Questions addressed.  The diagnostic results and clinical impression were discussed with the patient at bedside  and the patient demonstrated understanding.     Final diagnoses:  Acute otitis externa of right ear, unspecified type    ED Discharge Orders          Ordered    ciprofloxacin-dexamethasone (CIPRODEX) OTIC suspension  2 times daily        03/08/24 0252               Willma Duwaine CROME, [place] 03/08/24 0440    Raford Lenis, MD 03/08/24 [phone]

## 2024-03-08 ENCOUNTER — Emergency Department (HOSPITAL_COMMUNITY): Payer: Self-pay

## 2024-03-08 LAB — POTASSIUM: Potassium: 4.6 mmol/L (ref 3.5–5.1)

## 2024-03-08 MED ORDER — CIPROFLOXACIN-DEXAMETHASONE 0.3-0.1 % OT SUSP: 4.0000 [drp] | Freq: Two times a day (BID) | OTIC | 0 refills | Status: AC

## 2024-03-08 MED ORDER — IOHEXOL 350 MG/ML SOLN
75.0000 mL | Freq: Once | INTRAVENOUS | Status: AC | PRN
  Administered 2024-03-08: 75 mL via INTRAVENOUS

## 2024-03-08 NOTE — Discharge Instructions (Signed)
 Thank you for visiting the Emergency Department today. It was a pleasure to be part of your healthcare team. Your diagnosis is outer ear infection. You are being treated with antibiotic drops, and you should take your medications as directed. If you have any questions about your medicines, please call your pharmacy or healthcare provider. At home, rest, hydrate, and resume normal diet.  Keep the ear canal dry and avoid Q-tips. It is important to watch for warning signs such as worsening pain, fever, or swelling. If any of these happen, return to the Emergency Department or call 911. Thank you for trusting us  with your health.

## 2024-05-12 ENCOUNTER — Other Ambulatory Visit: Payer: Self-pay

## 2024-05-12 ENCOUNTER — Emergency Department (HOSPITAL_COMMUNITY): Payer: Self-pay

## 2024-05-12 ENCOUNTER — Encounter (HOSPITAL_COMMUNITY): Payer: Self-pay

## 2024-05-12 ENCOUNTER — Emergency Department (HOSPITAL_COMMUNITY)
Admission: EM | Admit: 2024-05-12 | Discharge: 2024-05-12 | Disposition: A | Discharge status: Home / Self Care | Payer: Self-pay | Attending: Emergency Medicine | Admitting: Emergency Medicine

## 2024-05-12 DIAGNOSIS — S50812A Abrasion of left forearm, initial encounter: Secondary | ICD-10-CM | POA: Insufficient documentation

## 2024-05-12 DIAGNOSIS — S0990XA Unspecified injury of head, initial encounter: Secondary | ICD-10-CM

## 2024-05-12 DIAGNOSIS — S50811A Abrasion of right forearm, initial encounter: Secondary | ICD-10-CM | POA: Insufficient documentation

## 2024-05-12 DIAGNOSIS — S0031XA Abrasion of nose, initial encounter: Secondary | ICD-10-CM | POA: Insufficient documentation

## 2024-05-12 MED ORDER — IBUPROFEN 400 MG PO TABS
600.0000 mg | ORAL_TABLET | Freq: Once | ORAL | Status: AC
  Administered 2024-05-12: 600 mg via ORAL
  Filled 2024-05-12: qty 1

## 2024-05-12 MED ORDER — OXYCODONE HCL 5 MG PO TABS
5.0000 mg | ORAL_TABLET | Freq: Once | ORAL | Status: AC
  Administered 2024-05-12: 5 mg via ORAL
  Filled 2024-05-12: qty 1

## 2024-05-12 MED ORDER — HYDROXYZINE HCL 25 MG PO TABS
25.0000 mg | ORAL_TABLET | Freq: Once | ORAL | Status: AC
  Administered 2024-05-12: 25 mg via ORAL
  Filled 2024-05-12: qty 1

## 2024-05-12 MED ORDER — HYDROXYZINE HCL 25 MG PO TABS: 25.0000 mg | ORAL_TABLET | Freq: Four times a day (QID) | ORAL | 0 refills | Status: AC | PRN

## 2024-05-12 NOTE — ED Triage Notes (Signed)
 Pt denies blood thinners. Pt's nose not currently bleeding
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
## 2024-05-12 NOTE — ED Triage Notes (Signed)
 PT arrives via POV. PT c/o nosebleed, dizziness, and headache since early this morning. Bleeding is currently controlled. Pt is AxOx4. No blood thinners.
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
## 2024-05-12 NOTE — Discharge Instructions (Addendum)
 Thankfully, no brain bleed or skull fracture was noted on your CT of the head today.  No fracture or dislocation in your forearm bones.  You appear to have a concussion which is a state of changed mental ability from trauma.  Refrain from any strenuous physical activities or any activities that require lots of focus for the next 48 hours. Decrease your screen time (time on phones, TV, laptop, etc) to no more than 30 minutes per day while symptoms persist and get at least 8 hours of sleep at night.   You may engage in light physical activity (such as walking) as long as it does not exacerbate your symptoms.  You have some bruising to the face and forearms which will start to heal with time.  You may take up to 1000mg  of tylenol  every 6 hours as needed for pain.  Do not take more then 4g per day.  You may use up to 600mg  ibuprofen  every 6 hours as needed for pain.  Do not exceed 2.4g of ibuprofen  per day.  Take 600mg  ibuprofen  (you were given a dose at 3:15pm today), then 3 hours later take 1000mg  tylenol , then 3 hours later 600mg  ibuprofen , then 3 hours later 1000mg  tylenol , so on and so forth for pain control.  You may apply ice to these areas of pain and swelling.  You have been prescribed a medication called hydroxyzine  to help with your dizziness.  You were given a dose here today at 3:15 PM.  You may take this every 6 hours as needed for dizziness.  Please return to the ER for any severe headache not controlled with Tylenol  or ibuprofen , persistent vomiting, vision changes, any other new or concerning symptoms
